# Patient Record
Sex: Female | Born: 1935 | Race: White | Hispanic: No | Smoking: Never smoker
Health system: Southern US, Community
[De-identification: ages and names within clinical notes are randomized; demographics above are authoritative.]

## PROBLEM LIST (undated history)

## (undated) DIAGNOSIS — E7800 Pure hypercholesterolemia, unspecified: Secondary | ICD-10-CM

## (undated) DIAGNOSIS — N390 Urinary tract infection, site not specified: Secondary | ICD-10-CM

## (undated) DIAGNOSIS — I499 Cardiac arrhythmia, unspecified: Secondary | ICD-10-CM

## (undated) DIAGNOSIS — H9190 Unspecified hearing loss, unspecified ear: Secondary | ICD-10-CM

## (undated) DIAGNOSIS — M1990 Unspecified osteoarthritis, unspecified site: Secondary | ICD-10-CM

## (undated) DIAGNOSIS — K635 Polyp of colon: Secondary | ICD-10-CM

## (undated) DIAGNOSIS — R519 Headache, unspecified: Secondary | ICD-10-CM

## (undated) DIAGNOSIS — I10 Essential (primary) hypertension: Secondary | ICD-10-CM

## (undated) DIAGNOSIS — H3530 Unspecified macular degeneration: Secondary | ICD-10-CM

## (undated) HISTORY — DX: Urinary tract infection, site not specified: N39.0

## (undated) HISTORY — DX: Unspecified osteoarthritis, unspecified site: M19.90

## (undated) HISTORY — DX: Unspecified macular degeneration: H35.30

## (undated) HISTORY — DX: Polyp of colon: K63.5

---

## 1958-12-15 HISTORY — PX: ECTOPIC PREGNANCY SURGERY: SHX613

## 1978-12-15 HISTORY — PX: ABDOMINAL HYSTERECTOMY: SHX81

## 1988-12-15 HISTORY — PX: ACHILLES TENDON SURGERY: SHX542

## 1995-12-16 HISTORY — PX: ACHILLES TENDON SURGERY: SHX542

## 2004-11-25 ENCOUNTER — Ambulatory Visit: Payer: Self-pay | Admitting: Unknown Physician Specialty

## 2006-01-27 ENCOUNTER — Ambulatory Visit: Payer: Self-pay | Admitting: Unknown Physician Specialty

## 2006-04-01 ENCOUNTER — Ambulatory Visit: Payer: Self-pay | Admitting: Unknown Physician Specialty

## 2007-01-28 ENCOUNTER — Ambulatory Visit: Payer: Self-pay | Admitting: Unknown Physician Specialty

## 2008-03-01 ENCOUNTER — Ambulatory Visit: Payer: Self-pay | Admitting: Unknown Physician Specialty

## 2009-03-05 ENCOUNTER — Ambulatory Visit: Payer: Self-pay | Admitting: Unknown Physician Specialty

## 2009-12-15 LAB — HM PAP SMEAR

## 2010-03-20 ENCOUNTER — Ambulatory Visit: Payer: Self-pay | Admitting: Unknown Physician Specialty

## 2010-05-31 ENCOUNTER — Encounter: Admission: RE | Admit: 2010-05-31 | Discharge: 2010-05-31 | Payer: Self-pay | Admitting: Podiatry

## 2010-12-15 LAB — HM COLONOSCOPY

## 2011-01-05 ENCOUNTER — Encounter: Payer: Self-pay | Admitting: Podiatry

## 2011-04-03 ENCOUNTER — Ambulatory Visit: Payer: Self-pay | Admitting: Unknown Physician Specialty

## 2011-07-17 ENCOUNTER — Ambulatory Visit: Payer: Self-pay | Admitting: Unknown Physician Specialty

## 2011-07-18 LAB — PATHOLOGY REPORT

## 2012-06-15 ENCOUNTER — Ambulatory Visit: Payer: Self-pay | Admitting: Internal Medicine

## 2013-07-18 ENCOUNTER — Ambulatory Visit: Payer: Self-pay | Admitting: Internal Medicine

## 2014-08-14 DIAGNOSIS — I10 Essential (primary) hypertension: Secondary | ICD-10-CM | POA: Insufficient documentation

## 2014-08-14 DIAGNOSIS — R002 Palpitations: Secondary | ICD-10-CM | POA: Insufficient documentation

## 2014-08-14 DIAGNOSIS — E785 Hyperlipidemia, unspecified: Secondary | ICD-10-CM | POA: Insufficient documentation

## 2014-08-15 LAB — HM MAMMOGRAPHY: HM MAMMO: NORMAL

## 2014-09-08 ENCOUNTER — Ambulatory Visit: Payer: Self-pay | Admitting: Family Medicine

## 2014-09-21 ENCOUNTER — Ambulatory Visit: Payer: Self-pay | Admitting: Family Medicine

## 2015-01-22 ENCOUNTER — Encounter: Payer: Self-pay | Admitting: Internal Medicine

## 2015-01-22 ENCOUNTER — Ambulatory Visit (INDEPENDENT_AMBULATORY_CARE_PROVIDER_SITE_OTHER): Payer: PPO | Admitting: Internal Medicine

## 2015-01-22 ENCOUNTER — Encounter: Payer: Self-pay | Admitting: *Deleted

## 2015-01-22 ENCOUNTER — Ambulatory Visit (INDEPENDENT_AMBULATORY_CARE_PROVIDER_SITE_OTHER)
Admission: RE | Admit: 2015-01-22 | Discharge: 2015-01-22 | Disposition: A | Discharge status: Home / Self Care | Payer: PPO | Source: Ambulatory Visit | Attending: Internal Medicine | Admitting: Internal Medicine

## 2015-01-22 VITALS — BP 135/55 | HR 55 | Temp 97.7°F | Resp 14 | Ht 63.5 in | Wt 167.5 lb

## 2015-01-22 DIAGNOSIS — R002 Palpitations: Secondary | ICD-10-CM

## 2015-01-22 DIAGNOSIS — E785 Hyperlipidemia, unspecified: Secondary | ICD-10-CM

## 2015-01-22 DIAGNOSIS — M79671 Pain in right foot: Secondary | ICD-10-CM | POA: Insufficient documentation

## 2015-01-22 DIAGNOSIS — I10 Essential (primary) hypertension: Secondary | ICD-10-CM

## 2015-01-22 LAB — COMPREHENSIVE METABOLIC PANEL
ALT: 16 U/L (ref 0–35)
AST: 20 U/L (ref 0–37)
Albumin: 3.9 g/dL (ref 3.5–5.2)
Alkaline Phosphatase: 59 U/L (ref 39–117)
BUN: 22 mg/dL (ref 6–23)
CO2: 28 meq/L (ref 19–32)
Calcium: 9.2 mg/dL (ref 8.4–10.5)
Chloride: 105 mEq/L (ref 96–112)
Creatinine, Ser: 0.99 mg/dL (ref 0.40–1.20)
GFR: 56.7 mL/min — ABNORMAL LOW (ref >60.00)
GLUCOSE: 94 mg/dL (ref 70–99)
POTASSIUM: 4.2 meq/L (ref 3.5–5.1)
Sodium: 139 mEq/L (ref 135–145)
Total Bilirubin: 0.5 mg/dL (ref 0.2–1.2)
Total Protein: 6.4 g/dL (ref 6.0–8.3)

## 2015-01-22 MED ORDER — TRIAMTERENE-HCTZ 37.5-25 MG PO TABS: 0.5000 | ORAL_TABLET | Freq: Every day | ORAL | Status: DC

## 2015-01-22 MED ORDER — ROSUVASTATIN CALCIUM 10 MG PO TABS: 5.0000 mg | ORAL_TABLET | ORAL | Status: DC

## 2015-01-22 MED ORDER — METOPROLOL SUCCINATE ER 50 MG PO TB24: 50.0000 mg | ORAL_TABLET | Freq: Every day | ORAL | Status: DC

## 2015-01-22 NOTE — Assessment & Plan Note (Signed)
Mild right foot pain at 2nd metatarsal after injury. Will get plain xray given persistence of pan.

## 2015-01-22 NOTE — Progress Notes (Signed)
Pre visit review using our clinic review tool, if applicable. No additional management support is needed unless otherwise documented below in the visit note. 

## 2015-01-22 NOTE — Assessment & Plan Note (Signed)
BP Readings from Last 3 Encounters:  01/22/15 135/55   BP well controlled on current mediations. Renal function with labs.

## 2015-01-22 NOTE — Assessment & Plan Note (Addendum)
Will check lipids and LFTs with labs. Continue Crestor. 

## 2015-01-22 NOTE — Assessment & Plan Note (Signed)
Symptoms well controlled with Metoprolol. Will continue.

## 2015-01-22 NOTE — Patient Instructions (Addendum)
Labs today.  Xray today of right foot.  Follow up in 6 months for Wellness Exam

## 2015-01-22 NOTE — Progress Notes (Signed)
Subjective:    Patient ID: Jessica Reynolds, female    DOB: May 06, 1928, 81 y.o.   MRN: [id number]  HPI 81YO female presents to establish care.  Injured right foot by slamming on tennis court about 2-3 weeks ago, when trying to get rock out of shoe. Foot was painful and swollen at time, but she was able to walk on it. Some persistent pain distally at base of 2nd toe. Swelling has resolved.  Aside from this, feeling well. Compliant with meds.  Past medical, surgical, family and social history per today's encounter.  Review of Systems  Constitutional: Negative for fever, chills, appetite change, fatigue and unexpected weight change.  Eyes: Negative for visual disturbance.  Respiratory: Negative for shortness of breath.   Cardiovascular: Negative for chest pain and leg swelling.  Gastrointestinal: Negative for nausea, vomiting, abdominal pain, diarrhea and constipation.  Musculoskeletal: Positive for arthralgias. Negative for myalgias, joint swelling and gait problem.  Skin: Negative for color change and rash.  Hematological: Negative for adenopathy. Does not bruise/bleed easily.  Psychiatric/Behavioral: Negative for suicidal ideas, sleep disturbance and dysphoric mood. The patient is not nervous/anxious.        Objective:    BP 135/55 mmHg  Pulse 55  Temp(Src) 97.7 F (36.5 C) (Oral)  Resp 14  Ht 5' 3.5" (1.613 m)  Wt 167 lb 8 oz (75.978 kg)  BMI 29.20 kg/m2  SpO2 99% Physical Exam  Constitutional: She is oriented to person, place, and time. She appears well-developed and well-nourished. No distress.  HENT:  Head: Normocephalic and atraumatic.  Right Ear: External ear normal.  Left Ear: External ear normal.  Nose: Nose normal.  Mouth/Throat: Oropharynx is clear and moist. No oropharyngeal exudate.  Eyes: Conjunctivae are normal. Pupils are equal, round, and reactive to light. Right eye exhibits no discharge. Left eye exhibits no discharge. No scleral icterus.  Neck:  Normal range of motion. Neck supple. No tracheal deviation present. No thyromegaly present.  Cardiovascular: Normal rate, regular rhythm, normal heart sounds and intact distal pulses.  Exam reveals no gallop and no friction rub.   No murmur heard. Pulmonary/Chest: Effort normal and breath sounds normal. No respiratory distress. She has no wheezes. She has no rales. She exhibits no tenderness.  Abdominal: Soft. Bowel sounds are normal. She exhibits no distension and no mass. There is no tenderness. There is no rebound and no guarding.  Musculoskeletal: Normal range of motion. She exhibits no edema.       Right foot: There is tenderness. There is normal range of motion and no bony tenderness.       Feet:  Lymphadenopathy:    She has no cervical adenopathy.  Neurological: She is alert and oriented to person, place, and time. No cranial nerve deficit. She exhibits normal muscle tone. Coordination normal.  Skin: Skin is warm and dry. No rash noted. She is not diaphoretic. No erythema. No pallor.  Psychiatric: She has a normal mood and affect. Her behavior is normal. Judgment and thought content normal.          Assessment & Plan:   Problem List Items Addressed This Visit      Unprioritized   Awareness of heartbeats    Symptoms well controlled with Metoprolol. Will continue.      Benign essential HTN - Primary    BP Readings from Last 3 Encounters:  01/22/15 135/55   BP well controlled on current mediations. Renal function with labs.      Relevant Medications  rosuvastatin (CRESTOR) tablet   triamterene-hydrochlorothiazide (MAXZIDE-25) 37.5-25 MG per tablet   metoprolol succinate (TOPROL-XL) 24 hr tablet   Other Relevant Orders   Comprehensive metabolic panel   HLD (hyperlipidemia)    Will check lipids and LFTs with labs. Continue Crestor      Relevant Medications   rosuvastatin (CRESTOR) tablet   triamterene-hydrochlorothiazide (MAXZIDE-25) 37.5-25 MG per tablet    metoprolol succinate (TOPROL-XL) 24 hr tablet   Right foot pain    Mild right foot pain at 2nd metatarsal after injury. Will get plain xray given persistence of pan.      Relevant Orders   DG Foot 2 Views Right       Return in about 6 months (around 07/23/2015) for Wellness Visit.

## 2015-07-23 ENCOUNTER — Ambulatory Visit (INDEPENDENT_AMBULATORY_CARE_PROVIDER_SITE_OTHER): Payer: PPO | Admitting: Internal Medicine

## 2015-07-23 ENCOUNTER — Encounter: Payer: Self-pay | Admitting: Internal Medicine

## 2015-07-23 VITALS — BP 124/68 | HR 53 | Temp 98.2°F | Ht 64.25 in | Wt 175.6 lb

## 2015-07-23 DIAGNOSIS — Z0000 Encounter for general adult medical examination without abnormal findings: Secondary | ICD-10-CM | POA: Diagnosis not present

## 2015-07-23 LAB — COMPREHENSIVE METABOLIC PANEL
ALBUMIN: 4 g/dL (ref 3.5–5.2)
ALK PHOS: 58 U/L (ref 39–117)
ALT: 17 U/L (ref 0–35)
AST: 21 U/L (ref 0–37)
BUN: 12 mg/dL (ref 6–23)
CO2: 27 mEq/L (ref 19–32)
Calcium: 9.1 mg/dL (ref 8.4–10.5)
Chloride: 105 mEq/L (ref 96–112)
Creatinine, Ser: 0.99 mg/dL (ref 0.40–1.20)
GFR: 56.63 mL/min — AB (ref >60.00)
Glucose, Bld: 86 mg/dL (ref 70–99)
POTASSIUM: 3.8 meq/L (ref 3.5–5.1)
SODIUM: 141 meq/L (ref 135–145)
Total Bilirubin: 0.6 mg/dL (ref 0.2–1.2)
Total Protein: 6.2 g/dL (ref 6.0–8.3)

## 2015-07-23 LAB — MICROALBUMIN / CREATININE URINE RATIO
Creatinine,U: 41 mg/dL
Microalb Creat Ratio: 1.7 mg/g (ref 0.0–30.0)
Microalb, Ur: <0.7 mg/dL (ref 0.0–1.9)

## 2015-07-23 LAB — CBC WITH DIFFERENTIAL/PLATELET
BASOS PCT: 0.4 % (ref 0.0–3.0)
Basophils Absolute: 0 10*3/uL (ref 0.0–0.1)
EOS PCT: 3.4 % (ref 0.0–5.0)
Eosinophils Absolute: 0.2 10*3/uL (ref 0.0–0.7)
HEMATOCRIT: 42.2 % (ref 36.0–46.0)
Hemoglobin: 14.3 g/dL (ref 12.0–15.0)
Lymphocytes Relative: 19.1 % (ref 12.0–46.0)
Lymphs Abs: 1 10*3/uL (ref 0.7–4.0)
MCHC: 33.8 g/dL (ref 30.0–36.0)
MCV: 93 fl (ref 78.0–100.0)
Monocytes Absolute: 0.4 10*3/uL (ref 0.1–1.0)
Monocytes Relative: 7 % (ref 3.0–12.0)
NEUTROS PCT: 70.1 % (ref 43.0–77.0)
Neutro Abs: 3.8 10*3/uL (ref 1.4–7.7)
Platelets: 175 10*3/uL (ref 150.0–400.0)
RBC: 4.53 Mil/uL (ref 3.87–5.11)
RDW: 13.9 % (ref 11.5–15.5)
WBC: 5.4 10*3/uL (ref 4.0–10.5)

## 2015-07-23 LAB — LIPID PANEL
Cholesterol: 238 mg/dL — ABNORMAL HIGH (ref 0–200)
HDL: 51.3 mg/dL (ref >39.00)
LDL CALC: 156 mg/dL — AB (ref 0–99)
NonHDL: 186.73
TRIGLYCERIDES: 156 mg/dL — AB (ref 0.0–149.0)
Total CHOL/HDL Ratio: 5
VLDL: 31.2 mg/dL (ref 0.0–40.0)

## 2015-07-23 NOTE — Addendum Note (Signed)
Addended by: Nanci Pina on: 07/23/2015 09:01 AM   Modules accepted: Miquel Dunn

## 2015-07-23 NOTE — Patient Instructions (Signed)

## 2015-07-23 NOTE — Progress Notes (Signed)
The patient is here for annual Medicare Wellness Examination and management of other chronic and acute problems.   The risk factors are reflected in the history.  The roster of all physicians providing medical care to patient - is listed in the Snapshot section of the chart.  Activities of daily living:   The patient is 100% independent in all ADLs: dressing, toileting, feeding as well as independent mobility. Patient lives with son. No pets. Lives in two story home. No difficulty with stairs.  Home safety :  The patient has smoke detectors in the home.  They wear seatbelts in their car. There are no firearms at home.  There is no violence in the home. They feel safe where they live.  Infectious Risks: There is no risks for hepatitis, STDs or HIV.  There is a history of blood transfusion with ectopic pregnancy in 1960. They have no travel history to infectious disease endemic areas of the world.  Additional Health Care Providers: The patient has seen their dentist in the last six months. Dentist - Dr. Jacelyn Grip They have seen their eye doctor in the last year. Opthalmologist - [facility] They deny hearing issues. They have deferred audiologic testing in the last year.   They do not  have excessive sun exposure. Discussed the need for sun protection: hats,long sleeves and use of sunscreen if there is significant sun exposure.  Dermatologist - Dr. Nehemiah Massed  Diet: the importance of a healthy diet is discussed. They do have a relatively healthy diet.  The benefits of regular aerobic exercise were discussed. Patient exercises by playing tennis.  Depression screen: there are no signs or vegative symptoms of depression- irritability, change in appetite, anhedonia, sadness/tearfullness.  Cognitive assessment: the patient manages all their financial and personal affairs and is actively engaged. They could relate day,date,year and events.  HCPOA - in place Living Will - in place  The  following portions of the patient's history were reviewed and updated as appropriate: allergies, current medications, past family history, past medical history,  past surgical history, past social history and problem list.  Visual acuity was not assessed per patient preference as they have regular follow up with their ophthalmologist. Hearing and body mass index were assessed and reviewed.   During the course of the visit the patient was educated and counseled about appropriate screening and preventive services including : fall prevention , diabetes screening, nutrition counseling, colorectal cancer screening, and recommended immunizations.    Review of Systems  Constitutional: Negative for fever, chills, appetite change, fatigue and unexpected weight change.  Eyes: Negative for visual disturbance.  Respiratory: Negative for shortness of breath.   Cardiovascular: Negative for chest pain and leg swelling.  Gastrointestinal: Negative for nausea, vomiting, abdominal pain, diarrhea, constipation and blood in stool.  Musculoskeletal: Negative for myalgias and arthralgias.  Skin: Negative for color change and rash.  Hematological: Negative for adenopathy. Does not bruise/bleed easily.  Psychiatric/Behavioral: Negative for suicidal ideas, sleep disturbance and dysphoric mood. The patient is not nervous/anxious.        Objective:    BP 124/68 mmHg  Pulse 53  Temp(Src) 98.2 F (36.8 C) (Oral)  Ht 5' 4.25" (1.632 m)  Wt 175 lb 9.6 oz (79.652 kg)  BMI 29.91 kg/m2  SpO2 96% Physical Exam  Constitutional: She is oriented to person, place, and time. She appears well-developed and well-nourished. No distress.  HENT:  Head: Normocephalic and atraumatic.  Right Ear: External ear normal.  Left Ear: External ear  normal.  Nose: Nose normal.  Mouth/Throat: Oropharynx is clear and moist. No oropharyngeal exudate.  Eyes: Conjunctivae are normal. Pupils are equal, round, and reactive to light. Right eye  exhibits no discharge. Left eye exhibits no discharge. No scleral icterus.  Neck: Normal range of motion. Neck supple. No tracheal deviation present. No thyromegaly present.  Cardiovascular: Normal rate, regular rhythm, normal heart sounds and intact distal pulses.  Exam reveals no gallop and no friction rub.   No murmur heard. Pulmonary/Chest: Effort normal and breath sounds normal. No accessory muscle usage. No tachypnea. No respiratory distress. She has no decreased breath sounds. She has no wheezes. She has no rales. She exhibits no tenderness. Right breast exhibits no inverted nipple, no mass, no nipple discharge, no skin change and no tenderness. Left breast exhibits no inverted nipple, no mass, no nipple discharge, no skin change and no tenderness. Breasts are symmetrical.  Abdominal: Soft. Bowel sounds are normal. She exhibits no distension and no mass. There is no tenderness. There is no rebound and no guarding.  Musculoskeletal: Normal range of motion. She exhibits no edema or tenderness.  Lymphadenopathy:    She has no cervical adenopathy.  Neurological: She is alert and oriented to person, place, and time. No cranial nerve deficit. She exhibits normal muscle tone. Coordination normal.  Skin: Skin is warm and dry. No rash noted. She is not diaphoretic. No erythema. No pallor.  Psychiatric: She has a normal mood and affect. Her behavior is normal. Judgment and thought content normal.          Assessment & Plan:  Patient was given a handout regarding current recommendations for health maintenance and preventative care on the AVS.  Problem List Items Addressed This Visit      Unprioritized   Medicare annual wellness visit, subsequent - Primary    General medical exam including breast exam normal today. Labs as ordered. Mammogram ordered. Colonoscopy UTD. Immunizations UTD except for Prevnar, however she believes she had this at [facility] and we will request records. Encouraged  healthy diet and exercise.      Relevant Orders   CBC with Differential/Platelet   Comprehensive metabolic panel   Lipid panel   Microalbumin / creatinine urine ratio   Hepatitis C antibody   MM Digital Screening       Return in about 6 months (around 01/23/2016) for Recheck.

## 2015-07-23 NOTE — Assessment & Plan Note (Signed)
General medical exam including breast exam normal today. Labs as ordered. Mammogram ordered. Colonoscopy UTD. Immunizations UTD except for Prevnar, however she believes she had this at [facility] and we will request records. Encouraged healthy diet and exercise.

## 2015-07-23 NOTE — Progress Notes (Signed)
Pre visit review using our clinic review tool, if applicable. No additional management support is needed unless otherwise documented below in the visit note. 

## 2015-07-24 LAB — HEPATITIS C ANTIBODY: HCV AB: NEGATIVE

## 2015-07-24 NOTE — Addendum Note (Signed)
Addended byCloyd Stagers B on: 07/24/2015 11:46 AM   Modules accepted: Miquel Dunn

## 2015-07-24 NOTE — Addendum Note (Signed)
Addended byCloyd Stagers B on: 07/24/2015 11:30 AM   Modules accepted: SmartSet

## 2015-07-24 NOTE — Addendum Note (Signed)
Addended byCloyd Stagers B on: 07/24/2015 11:28 AM   Modules accepted: Miquel Dunn

## 2015-09-25 ENCOUNTER — Ambulatory Visit
Admission: RE | Admit: 2015-09-25 | Discharge: 2015-09-25 | Disposition: A | Discharge status: Home / Self Care | Payer: PPO | Source: Ambulatory Visit | Attending: Internal Medicine | Admitting: Internal Medicine

## 2015-09-25 DIAGNOSIS — Z1231 Encounter for screening mammogram for malignant neoplasm of breast: Secondary | ICD-10-CM | POA: Insufficient documentation

## 2015-09-25 DIAGNOSIS — Z0000 Encounter for general adult medical examination without abnormal findings: Secondary | ICD-10-CM | POA: Insufficient documentation

## 2015-09-27 ENCOUNTER — Other Ambulatory Visit: Payer: Self-pay | Admitting: Internal Medicine

## 2015-09-27 ENCOUNTER — Other Ambulatory Visit: Payer: Self-pay

## 2015-09-27 DIAGNOSIS — R928 Other abnormal and inconclusive findings on diagnostic imaging of breast: Secondary | ICD-10-CM

## 2015-09-27 MED ORDER — METOPROLOL SUCCINATE ER 50 MG PO TB24: 50.0000 mg | ORAL_TABLET | Freq: Every day | ORAL | Status: DC

## 2015-09-27 MED ORDER — ROSUVASTATIN CALCIUM 10 MG PO TABS: 5.0000 mg | ORAL_TABLET | ORAL | Status: DC

## 2015-09-27 MED ORDER — TRIAMTERENE-HCTZ 37.5-25 MG PO TABS: 0.5000 | ORAL_TABLET | Freq: Every day | ORAL | Status: DC

## 2015-10-01 ENCOUNTER — Other Ambulatory Visit: Payer: Self-pay

## 2015-10-01 ENCOUNTER — Telehealth: Payer: Self-pay | Admitting: *Deleted

## 2015-10-01 MED ORDER — ROSUVASTATIN CALCIUM 5 MG PO TABS: 5.0000 mg | ORAL_TABLET | ORAL | Status: DC

## 2015-10-01 MED ORDER — FLUTICASONE PROPIONATE 50 MCG/ACT NA SUSP: 2.0000 | Freq: Every day | NASAL | Status: DC

## 2015-10-01 NOTE — Telephone Encounter (Signed)
This is not on patient's medication list.  Please advise?

## 2015-10-01 NOTE — Telephone Encounter (Signed)
Patient would like to refill Fluticasone 50 mg  . Did not see in med list. [facility]

## 2015-10-01 NOTE — Telephone Encounter (Signed)
Rx sent 

## 2015-10-10 ENCOUNTER — Ambulatory Visit
Admission: RE | Admit: 2015-10-10 | Discharge: 2015-10-10 | Disposition: A | Discharge status: Home / Self Care | Payer: PPO | Source: Ambulatory Visit | Attending: Internal Medicine | Admitting: Internal Medicine

## 2015-10-10 DIAGNOSIS — N63 Unspecified lump in breast: Secondary | ICD-10-CM | POA: Insufficient documentation

## 2015-10-10 DIAGNOSIS — R928 Other abnormal and inconclusive findings on diagnostic imaging of breast: Secondary | ICD-10-CM

## 2015-10-24 ENCOUNTER — Ambulatory Visit (INDEPENDENT_AMBULATORY_CARE_PROVIDER_SITE_OTHER): Payer: PPO

## 2015-10-24 DIAGNOSIS — Z23 Encounter for immunization: Secondary | ICD-10-CM

## 2015-12-21 ENCOUNTER — Telehealth: Payer: Self-pay | Admitting: Internal Medicine

## 2015-12-21 NOTE — Telephone Encounter (Signed)
Patient Name: Jessica Reynolds  DOB: 1928-04-23    Initial Comment Caller states may have a TIA- confusion   Nurse Assessment  Nurse: Mallie Mussel, RN, Alveta Heimlich Date/Time (Eastern Time): 12/21/2015 12:54:13 PM  Confirm and document reason for call. If symptomatic, describe symptoms. ---Caller states that she feels she had a TIA about an hour ago. At that time, she began having trouble with her speech. She was in a store, left and drove herself home. Her speech is clear now. This only lasted about 3-4 minutes. She has a headache at present which began about the time she got to her car. She rates the pain as 2-3 on 0-10 scale. Denies eye sagging and her smile is the same on both sides. She is able to make a fist with both hands and they both feel the same. She is able to extend her arms and hold them at the same level .  Has the patient traveled out of the country within the last 30 days? ---No  Does the patient have any new or worsening symptoms? ---Yes  Will a triage be completed? ---Yes  Related visit to physician within the last 2 weeks? ---No  Does the PT have any chronic conditions? (i.e. diabetes, asthma, etc.) ---Unknown  Is this a behavioral health or substance abuse call? ---No     Guidelines    Guideline Title Affirmed Question Affirmed Notes  Headache [1] New headache AND [2] age > 84    Final Disposition User   See Physician within [place], RN, Alveta Heimlich    Comments  1301- I began hearing a beeping sound on the phone and I asked if we were about to lose connection (this has happened in the past with that sound). She states that she is on a land line. Call was lost at that point. I tried x 3 to call her back, each time I received her voicemail. I left a message on the 3rd one that i will try to call back in about 10 minutes.  1307- Attempted to call back. Female states that I have a wrong number. Lead PC Jasmine asked to double check the number.  After review number on the chart should be  [phone]  No appointments available at [facility] or [facility]. She will go to an UC in [place], it is closer.   Referrals  GO TO FACILITY OTHER - SPECIFY   Disagree/Comply: Comply

## 2015-12-24 NOTE — Telephone Encounter (Signed)
I called to check in on patient.  She states she followed up with Eye doctor and was diagnosised with a "Aura" this has happened before, has a follow up with him again.  Denies any follow up with [place] at this point.  FYI.

## 2015-12-28 ENCOUNTER — Ambulatory Visit: Payer: PPO | Admitting: Family Medicine

## 2016-01-09 ENCOUNTER — Emergency Department
Admission: EM | Admit: 2016-01-09 | Discharge: 2016-01-09 | Disposition: A | Discharge status: Home / Self Care | Payer: PPO | Attending: Emergency Medicine | Admitting: Emergency Medicine

## 2016-01-09 ENCOUNTER — Emergency Department: Payer: PPO

## 2016-01-09 ENCOUNTER — Encounter: Payer: Self-pay | Admitting: *Deleted

## 2016-01-09 DIAGNOSIS — F419 Anxiety disorder, unspecified: Secondary | ICD-10-CM | POA: Diagnosis not present

## 2016-01-09 DIAGNOSIS — Z77098 Contact with and (suspected) exposure to other hazardous, chiefly nonmedicinal, chemicals: Secondary | ICD-10-CM | POA: Diagnosis not present

## 2016-01-09 DIAGNOSIS — Z79899 Other long term (current) drug therapy: Secondary | ICD-10-CM | POA: Insufficient documentation

## 2016-01-09 DIAGNOSIS — I10 Essential (primary) hypertension: Secondary | ICD-10-CM | POA: Insufficient documentation

## 2016-01-09 DIAGNOSIS — R51 Headache: Secondary | ICD-10-CM | POA: Insufficient documentation

## 2016-01-09 DIAGNOSIS — Z7951 Long term (current) use of inhaled steroids: Secondary | ICD-10-CM | POA: Diagnosis not present

## 2016-01-09 DIAGNOSIS — R519 Headache, unspecified: Secondary | ICD-10-CM

## 2016-01-09 DIAGNOSIS — R0602 Shortness of breath: Secondary | ICD-10-CM | POA: Diagnosis not present

## 2016-01-09 LAB — BLOOD GAS, ARTERIAL
ALLENS TEST (PASS/FAIL): POSITIVE — AB
Acid-Base Excess: 0.6 mmol/L (ref 0.0–3.0)
BICARBONATE: 24.5 meq/L (ref 21.0–28.0)
FIO2: 0.21
O2 Saturation: 96 %
PATIENT TEMPERATURE: 37
PCO2 ART: 36 mmHg (ref 32.0–48.0)
PH ART: 7.44 (ref 7.350–7.450)
PO2 ART: 79 mmHg — AB (ref 83.0–108.0)

## 2016-01-09 LAB — CARBOXYHEMOGLOBIN
CARBOXYHEMOGLOBIN: 1.7 % (ref 1.5–9.0)
Total oxygen content: 94.7 mL/dL

## 2016-01-09 LAB — CBC
HCT: 42.7 % (ref 35.0–47.0)
HEMOGLOBIN: 14 g/dL (ref 12.0–16.0)
MCH: 30.1 pg (ref 26.0–34.0)
MCHC: 32.7 g/dL (ref 32.0–36.0)
MCV: 92.2 fL (ref 80.0–100.0)
Platelets: 174 10*3/uL (ref 150–440)
RBC: 4.63 MIL/uL (ref 3.80–5.20)
RDW: 14 % (ref 11.5–14.5)
WBC: 6 10*3/uL (ref 3.6–11.0)

## 2016-01-09 LAB — BASIC METABOLIC PANEL
Anion gap: 7 (ref 5–15)
BUN: 23 mg/dL — AB (ref 6–20)
CHLORIDE: 107 mmol/L (ref 101–111)
CO2: 27 mmol/L (ref 22–32)
CREATININE: 0.99 mg/dL (ref 0.44–1.00)
Calcium: 9.2 mg/dL (ref 8.9–10.3)
GFR calc Af Amer: 59 mL/min — ABNORMAL LOW (ref >60)
GFR calc non Af Amer: 51 mL/min — ABNORMAL LOW (ref >60)
GLUCOSE: 145 mg/dL — AB (ref 65–99)
Potassium: 3.7 mmol/L (ref 3.5–5.1)
SODIUM: 141 mmol/L (ref 135–145)

## 2016-01-09 NOTE — Discharge Instructions (Signed)
You were evaluated for headaches, and although no certain cause was found, your exam and evaluation are reassuring tonight and emergency department.  Return to the emergency department for any worsening condition including confusion or altered mental status, weakness, numbness, or any other symptoms concerning to you.   General Headache Without Cause A headache is pain or discomfort felt around the head or neck area. There are many causes and types of headaches. In some cases, the cause may not be found.  HOME CARE  Managing Pain  Take over-the-counter and prescription medicines only as told by your doctor.  Lie down in a dark, quiet room when you have a headache.  If directed, apply ice to the head and neck area:  Put ice in a plastic bag.  Place a towel between your skin and the bag.  Leave the ice on for 20 minutes, 2-3 times per day.  Use a heating pad or hot shower to apply heat to the head and neck area as told by your doctor.  Keep lights dim if bright lights bother you or make your headaches worse. Eating and Drinking  Eat meals on a regular schedule.  Lessen how much alcohol you drink.  Lessen how much caffeine you drink, or stop drinking caffeine. General Instructions  Keep all follow-up visits as told by your doctor. This is important.  Keep a journal to find out if certain things bring on headaches. For example, write down:  What you eat and drink.  How much sleep you get.  Any change to your diet or medicines.  Relax by getting a massage or doing other relaxing activities.  Lessen stress.  Sit up straight. Do not tighten (tense) your muscles.  Do not use tobacco products. This includes cigarettes, chewing tobacco, or e-cigarettes. If you need help quitting, ask your doctor.  Exercise regularly as told by your doctor.  Get enough sleep. This often means 7-9 hours of sleep. GET HELP IF:  Your symptoms are not helped by medicine.  You have a  headache that feels different than the other headaches.  You feel sick to your stomach (nauseous) or you throw up (vomit).  You have a fever. GET HELP RIGHT AWAY IF:   Your headache becomes really bad.  You keep throwing up.  You have a stiff neck.  You have trouble seeing.  You have trouble speaking.  You have pain in the eye or ear.  Your muscles are weak or you lose muscle control.  You lose your balance or have trouble walking.  You feel like you will pass out (faint) or you pass out.  You have confusion.   This information is not intended to replace advice given to you by your health care provider. Make sure you discuss any questions you have with your health care provider.   Document Released: 09/09/2008 Document Revised: 08/22/2015 Document Reviewed: 03/26/2015 Elsevier Interactive Patient Education [employer].

## 2016-01-09 NOTE — ED Provider Notes (Signed)
[facility] Emergency Department Provider Note   ____________________________________________  Time seen:  I have reviewed the triage vital signs and the triage nursing note.  HISTORY  Chief Complaint Toxic Inhalation and Headache   Historian Patient  HPI Jessica Reynolds is a 82 y.o. female who lives at her home with her handicapped son, is here for evaluation of chronic frequent headaches over the past several months. They're global in nature. No vision changes. No neck pain. No fevers. Several weeks ago during a cold spell her furnace was "overworking" and her carbon monoxide detector went off. She unplugged the carbon monoxide detector did not seek evaluation. She has had the furnace turned off awaiting a new unit. However since her headaches have continued she decided that she wanted to be evaluated to make sure she did not experience carbon monoxidepoisoning.  She's been under a lot of stress recently. Multiple social stressors between grief losing her husband several years ago, trying to sell her parents house, dealing with health issues of her sister who lives in another state. No chest pain or trouble breathing. No nausea or vomiting or diarrhea.    Past Medical History  Diagnosis Date  . Arthritis   . Colon polyp   . UTI (lower urinary tract infection)     Patient Active Problem List   Diagnosis Date Noted  . Medicare annual wellness visit, subsequent 07/23/2015  . Right foot pain 01/22/2015  . Benign essential HTN 08/14/2014  . HLD (hyperlipidemia) 08/14/2014  . Awareness of heartbeats 08/14/2014    Past Surgical History  Procedure Laterality Date  . Ectopic pregnancy surgery  1960  . Achilles tendon surgery Right 1990  . Achilles tendon surgery Left 1997  . Vaginal delivery      4  . Abdominal hysterectomy  1980    BSO, fibroids    Current Outpatient Rx  Name  Route  Sig  Dispense  Refill  . Cholecalciferol (VITAMIN D) 2000 UNITS  tablet   Oral   Take 2,000 Units by mouth daily.         . fluticasone (FLONASE) 50 MCG/ACT nasal spray   Each Nare   Place 2 sprays into both nostrils daily.   16 g   6   . metoprolol succinate (TOPROL-XL) 50 MG 24 hr tablet   Oral   Take 1 tablet (50 mg total) by mouth daily. TAKE ONE TABLET BY MOUTH ONCE DAILY   90 tablet   3   . Multiple Vitamins-Minerals (PRESERVISION/LUTEIN) CAPS   Oral   Take 2 capsules by mouth daily.         . naproxen sodium (ANAPROX) 220 MG tablet   Oral   Take 220 mg by mouth 2 (two) times daily as needed.         . rosuvastatin (CRESTOR) 5 MG tablet   Oral   Take 1 tablet (5 mg total) by mouth 3 (three) times a week.   12 tablet   3   . triamterene-hydrochlorothiazide (MAXZIDE-25) 37.5-25 MG tablet   Oral   Take 0.5 tablets by mouth daily. Take 1/2 tablet by mouth every morning.   90 tablet   3     Allergies Review of patient's allergies indicates no known allergies.  Family History  Problem Relation Age of Onset  . Stroke Mother   . Heart disease Father   . Macular degeneration Sister   . Diabetes Sister   . Breast cancer Neg Hx  Social History Social History  Substance Use Topics  . Smoking status: Never Smoker   . Smokeless tobacco: Never Used  . Alcohol Use: Yes     Comment: social    Review of Systems  Constitutional: Negative for fever. Eyes: Negative for visual changes. ENT: Negative for sore throat. Cardiovascular: Negative for chest pain. Respiratory: Negative for shortness of breath. Gastrointestinal: Negative for abdominal pain, vomiting and diarrhea. Genitourinary: Negative for dysuria. Musculoskeletal: Negative for back pain. Skin: Negative for rash. Neurological: No headache now but history of headaches.. 10 point Review of Systems otherwise negative ____________________________________________   PHYSICAL EXAM:  VITAL SIGNS: ED Triage Vitals  Enc Vitals Group     BP 01/09/16 1725  156/66 mmHg     Pulse Rate 01/09/16 1725 63     Resp 01/09/16 1725 16     Temp 01/09/16 1725 97.5 F (36.4 C)     Temp Source 01/09/16 1725 Oral     SpO2 01/09/16 1725 98 %     Weight 01/09/16 1725 165 lb (74.844 kg)     Height 01/09/16 1725 5\' 4"  (1.626 m)     Head Cir --      Peak Flow --      Pain Score --      Pain Loc --      Pain Edu? --      Excl. in [place]? --      Constitutional: Alert and oriented. Well appearing and in no distress. Eyes: Conjunctivae are normal. PERRL. Normal extraocular movements. ENT   Head: Normocephalic and atraumatic.   Nose: No congestion/rhinnorhea.   Mouth/Throat: Mucous membranes are moist.   Neck: No stridor. Cardiovascular/Chest: Normal rate, regular rhythm.  No murmurs, rubs, or gallops. Respiratory: Normal respiratory effort without tachypnea nor retractions. Breath sounds are clear and equal bilaterally. No wheezes/rales/rhonchi. Gastrointestinal: Soft. No distention, no guarding, no rebound. Nontender.    Genitourinary/rectal:Deferred Musculoskeletal: Nontender with normal range of motion in all extremities. No joint effusions.  No lower extremity tenderness.  No edema. Neurologic:  Normal speech and language. No gross or focal neurologic deficits are appreciated. Skin:  Skin is warm, dry and intact. No rash noted. Psychiatric: Mildly anxious. Mood and affect are normal. Speech and behavior are normal. Patient exhibits appropriate insight and judgment.  ____________________________________________   EKG I, Lisa Roca, MD, the attending physician have personally viewed and interpreted all ECGs.  62 bpm. Normal sinus rhythm. Narrow QRS.  Normal axis. Normal ST and T-wave ____________________________________________  LABS (pertinent positives/negatives)  CBC within normal limits Basic metabolic panel within normal limits Carboxyhemoglobin 1.7 PH 7.44, CO2 36, PO2  79  ____________________________________________  RADIOLOGY All Xrays were viewed by me. Imaging interpreted by Radiologist.  CT head noncontrast: No acute intracranial abnormalities  Chest two-view: Negative __________________________________________  PROCEDURES  Procedure(s) performed: None  Critical Care performed: None  ____________________________________________   ED COURSE / ASSESSMENT AND PLAN  Pertinent labs & imaging results that were available during my care of the patient were reviewed by me and considered in my medical decision making (see chart for details).    Patient is here to be evaluated for chronic headaches over the past several months and she is a little concerned that she could be experiencing carbon monoxide poisoning. Currently she doesn't have a headache. Her carboxyhemoglobin level is normal. In talking with her further, it sounds like may be stress is contributing to tension headaches. Symptoms do not seem consistent with temporal arteritis. We did discuss  neuroimaging, and did a head CT and this was negative for any acute source or cause for headache.  She was instructed to replace her car monoxide detector.  She will follow-up with a primary care physician.    CONSULTATIONS:   None   Patient / Family / Caregiver informed of clinical course, medical decision-making process, and agree with plan.   I discussed return precautions, follow-up instructions, and discharged instructions with patient and/or family.   ___________________________________________   FINAL CLINICAL IMPRESSION(S) / ED DIAGNOSES   Final diagnoses:  Generalized headaches              Note: This dictation was prepared with Dragon dictation. Any transcriptional errors that result from this process are unintentional   Lisa Roca, MD 01/09/16 2120

## 2016-01-09 NOTE — ED Notes (Signed)
Pt ambulatory to triage.  Pt reports appro 2 weeks ago pt's carbon monoxide detector went off in the house.  Pt has intermittent h/a's.  Pt has not seen a doctor since the heating furnace has been turned off and pt is still in the same house.  Pt wants to be checked for poisoning.  Pt alert.  Speech clear.

## 2016-01-17 DIAGNOSIS — D1800 Hemangioma unspecified site: Secondary | ICD-10-CM | POA: Diagnosis not present

## 2016-01-17 DIAGNOSIS — L570 Actinic keratosis: Secondary | ICD-10-CM | POA: Diagnosis not present

## 2016-01-17 DIAGNOSIS — L812 Freckles: Secondary | ICD-10-CM | POA: Diagnosis not present

## 2016-01-17 DIAGNOSIS — Z1283 Encounter for screening for malignant neoplasm of skin: Secondary | ICD-10-CM | POA: Diagnosis not present

## 2016-01-17 DIAGNOSIS — Z85828 Personal history of other malignant neoplasm of skin: Secondary | ICD-10-CM | POA: Diagnosis not present

## 2016-01-17 DIAGNOSIS — L821 Other seborrheic keratosis: Secondary | ICD-10-CM | POA: Diagnosis not present

## 2016-01-17 DIAGNOSIS — L578 Other skin changes due to chronic exposure to nonionizing radiation: Secondary | ICD-10-CM | POA: Diagnosis not present

## 2016-01-28 ENCOUNTER — Encounter: Payer: Self-pay | Admitting: Internal Medicine

## 2016-01-28 ENCOUNTER — Ambulatory Visit (INDEPENDENT_AMBULATORY_CARE_PROVIDER_SITE_OTHER): Payer: PPO | Admitting: Internal Medicine

## 2016-01-28 VITALS — BP 129/67 | HR 54 | Temp 97.8°F | Ht 64.0 in | Wt 173.0 lb

## 2016-01-28 DIAGNOSIS — Z23 Encounter for immunization: Secondary | ICD-10-CM | POA: Diagnosis not present

## 2016-01-28 DIAGNOSIS — F4323 Adjustment disorder with mixed anxiety and depressed mood: Secondary | ICD-10-CM | POA: Insufficient documentation

## 2016-01-28 DIAGNOSIS — E785 Hyperlipidemia, unspecified: Secondary | ICD-10-CM | POA: Diagnosis not present

## 2016-01-28 DIAGNOSIS — E559 Vitamin D deficiency, unspecified: Secondary | ICD-10-CM

## 2016-01-28 DIAGNOSIS — I10 Essential (primary) hypertension: Secondary | ICD-10-CM

## 2016-01-28 LAB — VITAMIN D 25 HYDROXY (VIT D DEFICIENCY, FRACTURES): VITD: 37.49 ng/mL (ref 30.00–100.00)

## 2016-01-28 LAB — COMPREHENSIVE METABOLIC PANEL
ALBUMIN: 4.3 g/dL (ref 3.5–5.2)
ALK PHOS: 62 U/L (ref 39–117)
ALT: 17 U/L (ref 0–35)
AST: 20 U/L (ref 0–37)
BUN: 22 mg/dL (ref 6–23)
CO2: 30 mEq/L (ref 19–32)
CREATININE: 1.01 mg/dL (ref 0.40–1.20)
Calcium: 9.3 mg/dL (ref 8.4–10.5)
Chloride: 104 mEq/L (ref 96–112)
GFR: 55.27 mL/min — ABNORMAL LOW (ref >60.00)
Glucose, Bld: 96 mg/dL (ref 70–99)
POTASSIUM: 4.1 meq/L (ref 3.5–5.1)
SODIUM: 140 meq/L (ref 135–145)
TOTAL PROTEIN: 6.6 g/dL (ref 6.0–8.3)
Total Bilirubin: 0.4 mg/dL (ref 0.2–1.2)

## 2016-01-28 NOTE — Addendum Note (Signed)
Addended by: Vernetta Honey on: 01/28/2016 08:27 AM   Modules accepted: Orders

## 2016-01-28 NOTE — Assessment & Plan Note (Signed)
Reviewed recent ER notes. Significant stress likely contributing to headache and other symptoms. Encouraged her to consider counseling. She will let us know if she wants to proceed with this.

## 2016-01-28 NOTE — Assessment & Plan Note (Signed)
BP Readings from Last 3 Encounters:  01/28/16 129/67  01/09/16 127/73  07/23/15 124/68   BP well controlled. Renal function with labs. Continue Metoprolol and Maxide.

## 2016-01-28 NOTE — Assessment & Plan Note (Signed)
Will check LFTs with labs. Continue Crestor. 

## 2016-01-28 NOTE — Progress Notes (Signed)
Pre visit review using our clinic review tool, if applicable. No additional management support is needed unless otherwise documented below in the visit note. 

## 2016-01-28 NOTE — Progress Notes (Signed)
Subjective:    Patient ID: Jessica Reynolds, female    DOB: May 13, 1928, 82 y.o.   MRN: [id number]  HPI  82YO female presents for follow up.  HTN - Compliant with medication. No CP, HA.  Seen in ED 1/25 for headache and slurred speech. CO detector had gone off at home. Had furnace replaced. Symptoms attributed to stress. CT head and CXR were normal. Labs normal. Also has stress of handicap son living with her. Working out care plan for him with other children. Trying to sell former home in [place]. Notes significant stress with this.   Wt Readings from Last 3 Encounters:  01/28/16 173 lb (78.472 kg)  01/09/16 165 lb (74.844 kg)  07/23/15 175 lb 9.6 oz (79.652 kg)   BP Readings from Last 3 Encounters:  01/28/16 129/67  01/09/16 127/73  07/23/15 124/68    Past Medical History  Diagnosis Date  . Arthritis   . Colon polyp   . UTI (lower urinary tract infection)    Family History  Problem Relation Age of Onset  . Stroke Mother   . Heart disease Father   . Macular degeneration Sister   . Diabetes Sister   . Breast cancer Neg Hx    Past Surgical History  Procedure Laterality Date  . Ectopic pregnancy surgery  1960  . Achilles tendon surgery Right 1990  . Achilles tendon surgery Left 1997  . Vaginal delivery      4  . Abdominal hysterectomy  1980    BSO, fibroids   Social History   Social History  . Marital Status: Unknown    Spouse Name: N/A  . Number of Children: N/A  . Years of Education: N/A   Social History Main Topics  . Smoking status: Never Smoker   . Smokeless tobacco: Never Used  . Alcohol Use: Yes     Comment: social  . Drug Use: No  . Sexual Activity: Not Asked   Other Topics Concern  . None   Social History Narrative   Born in [place], [place].      Lives in [place] with son. Husband passed away 04-10-12. No pets.      Work - retired for [employer] - regular      Exercise - tennis,  3-5 days per week    Review of  Systems  Constitutional: Negative for fever, chills, appetite change, fatigue and unexpected weight change.  Eyes: Negative for visual disturbance.  Respiratory: Negative for shortness of breath.   Cardiovascular: Negative for chest pain and leg swelling.  Gastrointestinal: Negative for nausea, vomiting, abdominal pain, diarrhea and constipation.  Musculoskeletal: Negative for myalgias and arthralgias.  Skin: Negative for color change and rash.  Hematological: Negative for adenopathy. Does not bruise/bleed easily.  Psychiatric/Behavioral: Positive for dysphoric mood. Negative for decreased concentration. The patient is nervous/anxious.        Objective:    BP 129/67 mmHg  Pulse 54  Temp(Src) 97.8 F (36.6 C) (Oral)  Ht 5\' 4"  (1.626 m)  Wt 173 lb (78.472 kg)  BMI 29.68 kg/m2  SpO2 95% Physical Exam  Constitutional: She is oriented to person, place, and time. She appears well-developed and well-nourished. No distress.  HENT:  Head: Normocephalic and atraumatic.  Right Ear: External ear normal.  Left Ear: External ear normal.  Nose: Nose normal.  Mouth/Throat: Oropharynx is clear and moist. No oropharyngeal exudate.  Eyes: Conjunctivae are normal. Pupils are equal, round, and  reactive to light. Right eye exhibits no discharge. Left eye exhibits no discharge. No scleral icterus.  Neck: Normal range of motion. Neck supple. No tracheal deviation present. No thyromegaly present.  Cardiovascular: Normal rate, regular rhythm, normal heart sounds and intact distal pulses.  Exam reveals no gallop and no friction rub.   No murmur heard. Pulmonary/Chest: Effort normal and breath sounds normal. No respiratory distress. She has no wheezes. She has no rales. She exhibits no tenderness.  Musculoskeletal: Normal range of motion. She exhibits no edema or tenderness.  Lymphadenopathy:    She has no cervical adenopathy.  Neurological: She is alert and oriented to person, place, and time. No cranial  nerve deficit. She exhibits normal muscle tone. Coordination normal.  Skin: Skin is warm and dry. No rash noted. She is not diaphoretic. No erythema. No pallor.  Psychiatric: She has a normal mood and affect. Her behavior is normal. Judgment and thought content normal.          Assessment & Plan:   Problem List Items Addressed This Visit      Unprioritized   Adjustment disorder with mixed anxiety and depressed mood    Reviewed recent ER notes. Significant stress likely contributing to headache and other symptoms. Encouraged her to consider counseling. She will let us know if she wants to proceed with this.      Benign essential HTN - Primary    BP Readings from Last 3 Encounters:  01/28/16 129/67  01/09/16 127/73  07/23/15 124/68   BP well controlled. Renal function with labs. Continue Metoprolol and Maxide.      Relevant Orders   Comprehensive metabolic panel   HLD (hyperlipidemia)    Will check LFTs with labs. Continue Crestor.       Other Visit Diagnoses    Vitamin D deficiency        Relevant Orders    Vitamin D (25 hydroxy)        Return in about 6 months (around 07/27/2016) for Physical.

## 2016-01-28 NOTE — Patient Instructions (Addendum)
Let us know if you are interested in speaking with a counselor.  Labs today.  Prevnar vaccine today.

## 2016-01-29 ENCOUNTER — Telehealth: Payer: Self-pay

## 2016-01-29 NOTE — Telephone Encounter (Signed)
Pt states that Almyra Free called her for test results and she was calling back to find out. Pt did not answer the phone.

## 2016-02-08 ENCOUNTER — Other Ambulatory Visit: Payer: Self-pay | Admitting: Internal Medicine

## 2016-02-08 NOTE — Telephone Encounter (Signed)
Sent to the pharmacy by e-scribe.  Pt to return in 6 months.

## 2016-03-04 DIAGNOSIS — H353131 Nonexudative age-related macular degeneration, bilateral, early dry stage: Secondary | ICD-10-CM | POA: Diagnosis not present

## 2016-03-04 DIAGNOSIS — H2513 Age-related nuclear cataract, bilateral: Secondary | ICD-10-CM | POA: Diagnosis not present

## 2016-07-18 DIAGNOSIS — R300 Dysuria: Secondary | ICD-10-CM | POA: Diagnosis not present

## 2016-07-29 ENCOUNTER — Encounter: Payer: PPO | Admitting: Internal Medicine

## 2016-08-05 ENCOUNTER — Other Ambulatory Visit: Payer: Self-pay

## 2016-08-05 MED ORDER — ROSUVASTATIN CALCIUM 5 MG PO TABS: ORAL_TABLET | ORAL | 5 refills | Status: DC

## 2016-08-05 NOTE — Telephone Encounter (Signed)
Last OV was 01/28/2016 with Gilford Rile, no follow up scheduled.  Last Labs were 07/23/2015 with lipids.  Please advise refill on lipitor. thanks

## 2016-08-12 DIAGNOSIS — E7800 Pure hypercholesterolemia, unspecified: Secondary | ICD-10-CM | POA: Diagnosis not present

## 2016-08-12 DIAGNOSIS — I10 Essential (primary) hypertension: Secondary | ICD-10-CM | POA: Diagnosis not present

## 2016-08-12 DIAGNOSIS — Z131 Encounter for screening for diabetes mellitus: Secondary | ICD-10-CM | POA: Diagnosis not present

## 2016-08-12 DIAGNOSIS — Z0000 Encounter for general adult medical examination without abnormal findings: Secondary | ICD-10-CM | POA: Diagnosis not present

## 2016-08-12 DIAGNOSIS — Z1239 Encounter for other screening for malignant neoplasm of breast: Secondary | ICD-10-CM | POA: Diagnosis not present

## 2016-09-08 DIAGNOSIS — H353131 Nonexudative age-related macular degeneration, bilateral, early dry stage: Secondary | ICD-10-CM | POA: Diagnosis not present

## 2016-10-02 ENCOUNTER — Other Ambulatory Visit: Payer: Self-pay

## 2016-10-02 MED ORDER — FLUTICASONE PROPIONATE 50 MCG/ACT NA SUSP: 2.0000 | Freq: Every day | NASAL | 6 refills | Status: DC

## 2016-10-02 MED ORDER — METOPROLOL SUCCINATE ER 50 MG PO TB24: 50.0000 mg | ORAL_TABLET | Freq: Every day | ORAL | 3 refills | Status: DC

## 2016-10-02 MED ORDER — TRIAMTERENE-HCTZ 37.5-25 MG PO TABS: 0.5000 | ORAL_TABLET | Freq: Every day | ORAL | 3 refills | Status: DC

## 2016-10-02 NOTE — Telephone Encounter (Signed)
Patient is a former patient of Gilford Rile, no follow up scheduled, please advise.  Last seen in February 2017. thanks

## 2016-10-08 ENCOUNTER — Other Ambulatory Visit: Payer: Self-pay | Admitting: Internal Medicine

## 2016-10-08 DIAGNOSIS — Z1231 Encounter for screening mammogram for malignant neoplasm of breast: Secondary | ICD-10-CM

## 2016-11-11 ENCOUNTER — Ambulatory Visit: Payer: PPO | Attending: Internal Medicine

## 2016-11-24 DIAGNOSIS — E7800 Pure hypercholesterolemia, unspecified: Secondary | ICD-10-CM | POA: Diagnosis not present

## 2016-11-24 DIAGNOSIS — R7303 Prediabetes: Secondary | ICD-10-CM | POA: Diagnosis not present

## 2016-11-24 DIAGNOSIS — N183 Chronic kidney disease, stage 3 (moderate): Secondary | ICD-10-CM | POA: Diagnosis not present

## 2016-11-24 DIAGNOSIS — M150 Primary generalized (osteo)arthritis: Secondary | ICD-10-CM | POA: Diagnosis not present

## 2016-11-24 DIAGNOSIS — M159 Polyosteoarthritis, unspecified: Secondary | ICD-10-CM | POA: Insufficient documentation

## 2016-11-24 DIAGNOSIS — I10 Essential (primary) hypertension: Secondary | ICD-10-CM | POA: Diagnosis not present

## 2016-12-13 DIAGNOSIS — N390 Urinary tract infection, site not specified: Secondary | ICD-10-CM | POA: Diagnosis not present

## 2016-12-13 DIAGNOSIS — R300 Dysuria: Secondary | ICD-10-CM | POA: Diagnosis not present

## 2016-12-22 ENCOUNTER — Ambulatory Visit
Admission: RE | Admit: 2016-12-22 | Discharge: 2016-12-22 | Disposition: A | Discharge status: Home / Self Care | Payer: PPO | Source: Ambulatory Visit | Attending: Internal Medicine | Admitting: Internal Medicine

## 2016-12-22 DIAGNOSIS — Z1231 Encounter for screening mammogram for malignant neoplasm of breast: Secondary | ICD-10-CM | POA: Diagnosis not present

## 2016-12-29 IMAGING — CR DG CHEST 2V
2 series · 2 of 2 positions shown · non-contrast
Comparison: None.

CLINICAL DATA: Shortness of breath and headaches for 2 weeks.
Initial encounter.

EXAM:
CHEST  2 VIEW

[chest pa]
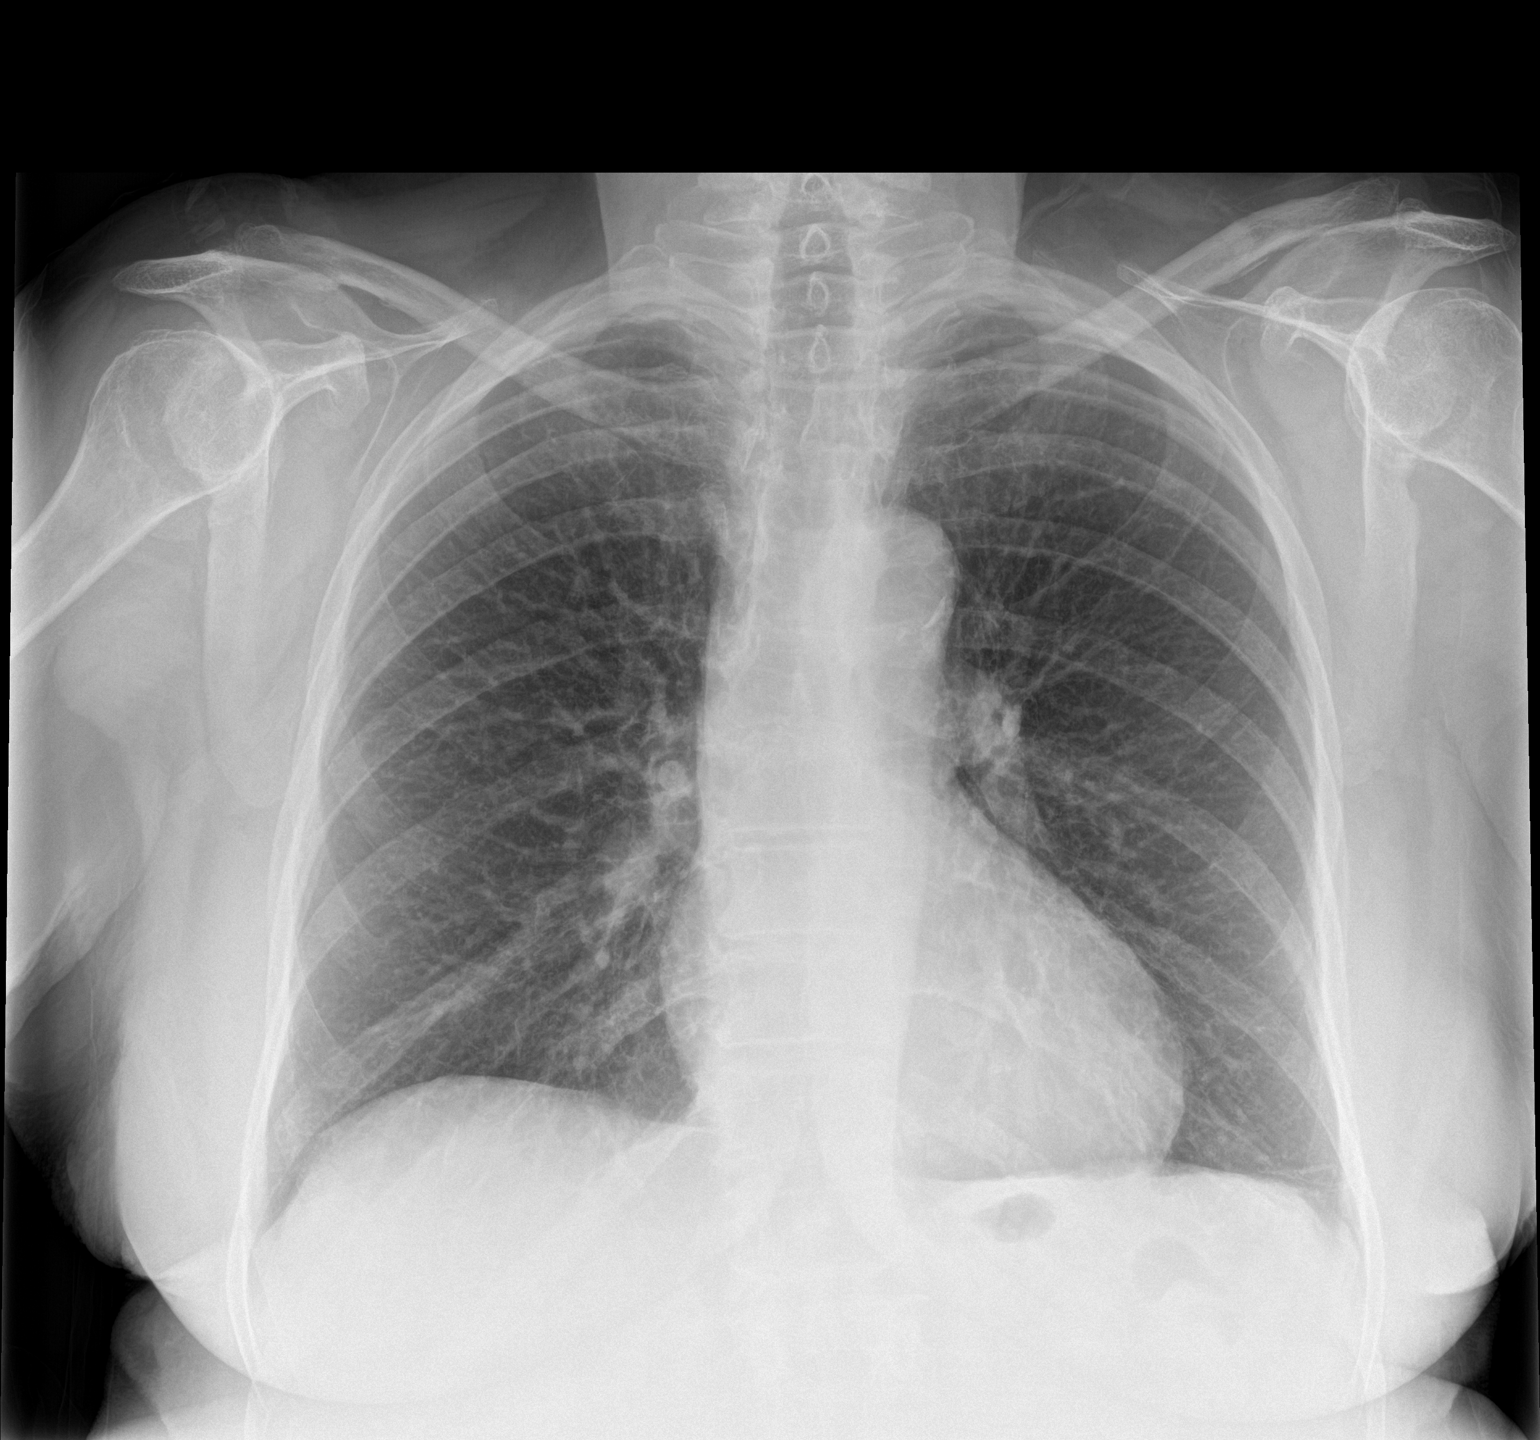

[chest lat]
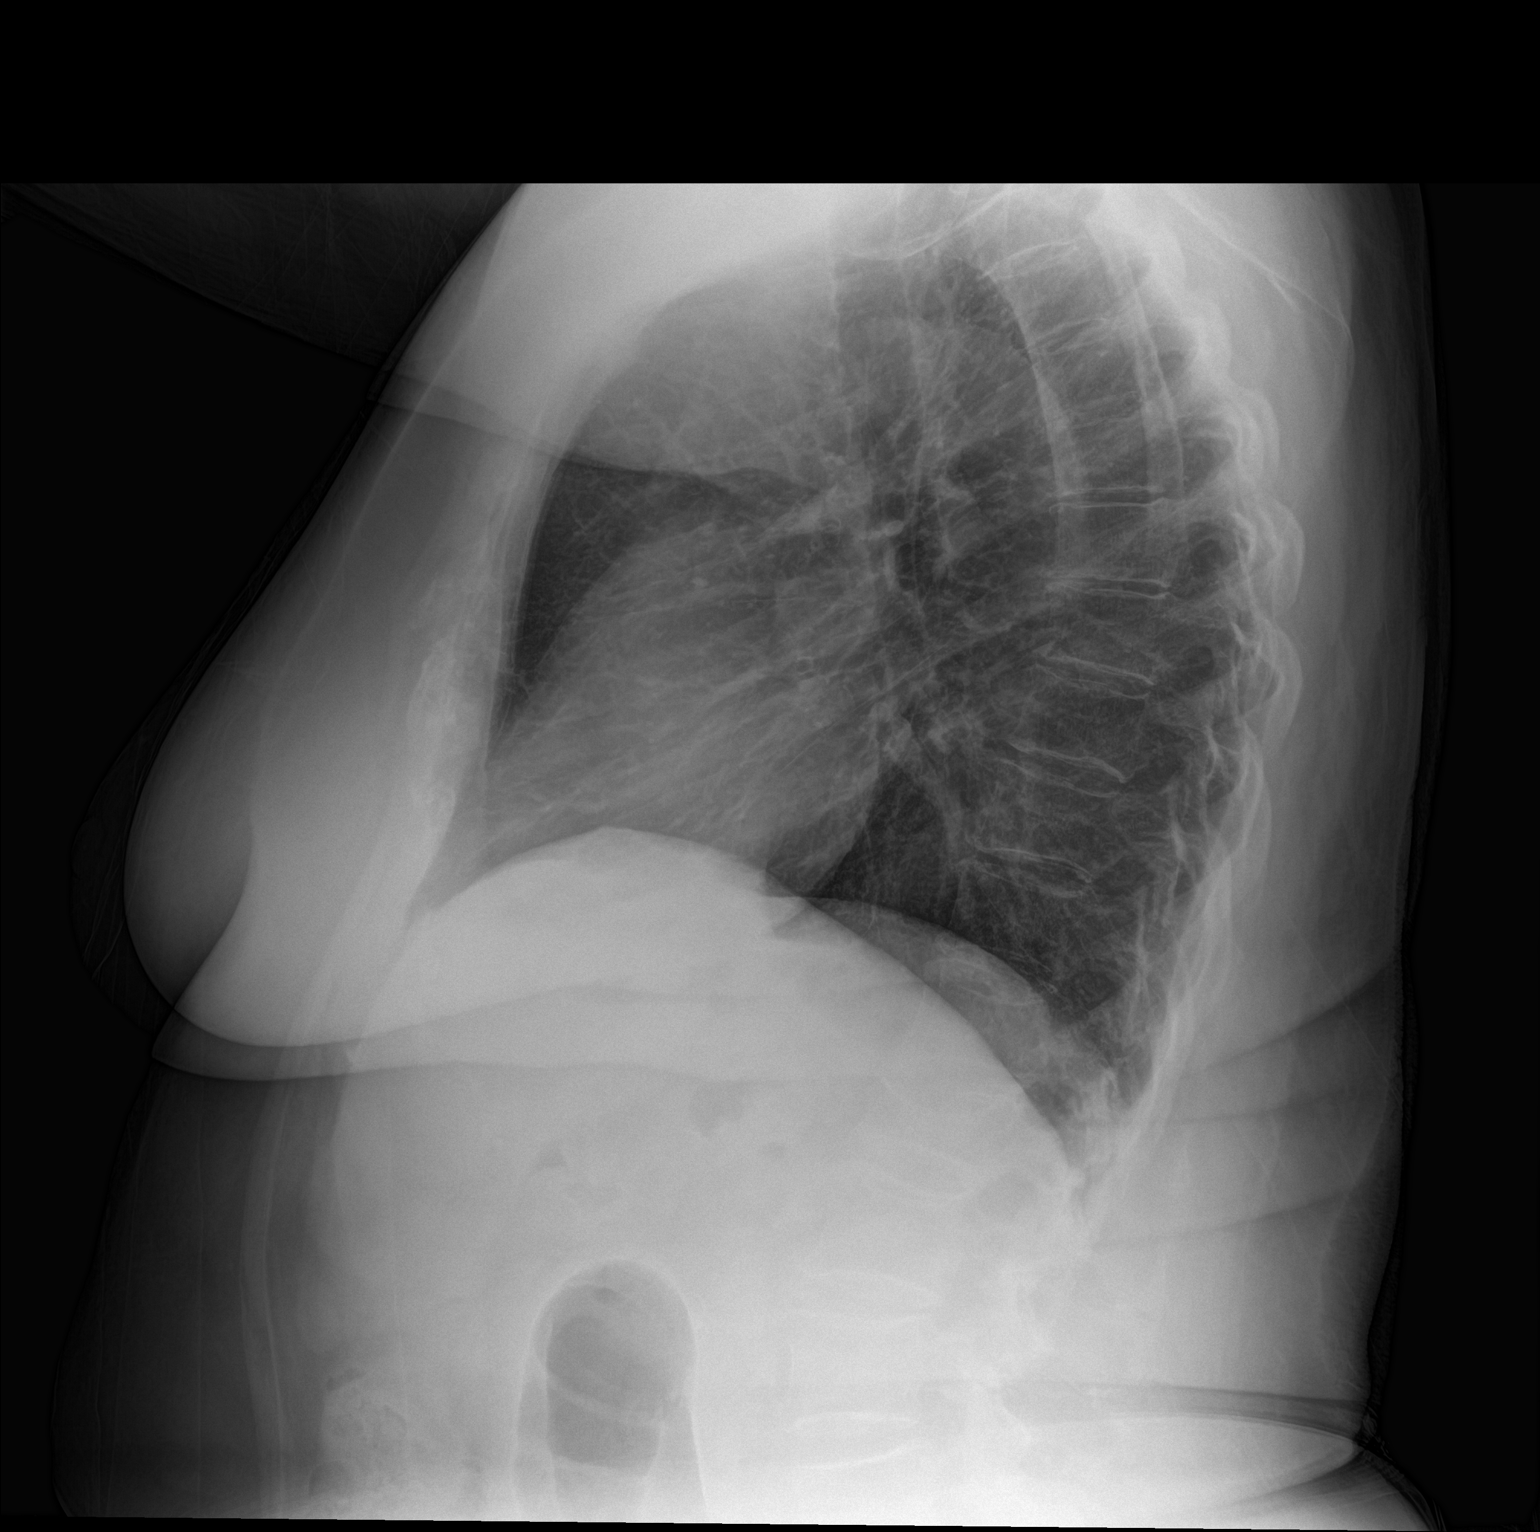

[2 of 2 positions shown; findings below may reference images not displayed]

FINDINGS: The lungs are clear. Heart size is normal. There is no pneumothorax
or pleural effusion. No focal bony abnormality.
IMPRESSION: Negative chest.

## 2016-12-29 IMAGING — CT CT HEAD W/O CM
1 series · 16 of 30 positions shown, 20 images · non-contrast
Comparison: None.

CLINICAL DATA: Headaches. Carbon monoxide detector went off in the
house 2 weeks ago.

EXAM:
CT HEAD WITHOUT CONTRAST
TECHNIQUE: Contiguous axial images were obtained from the base of the skull
through the vertex without intravenous contrast.

[Series 2: head wo · axial · 0.43mm/px · z∈[+483,+627]mm · 16 of 36 slices shown, 20 images]
[im 2/36  brain]
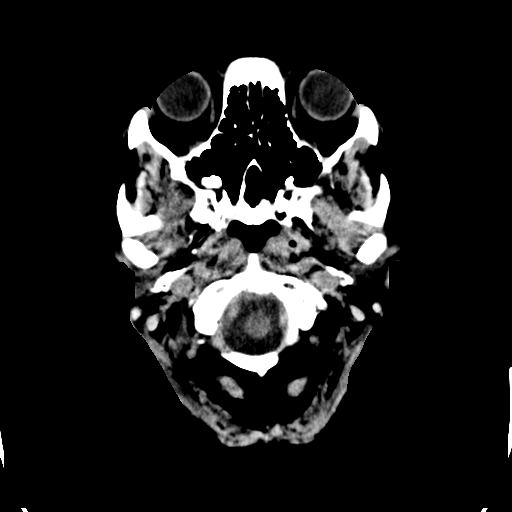
[im 2/36  bone]
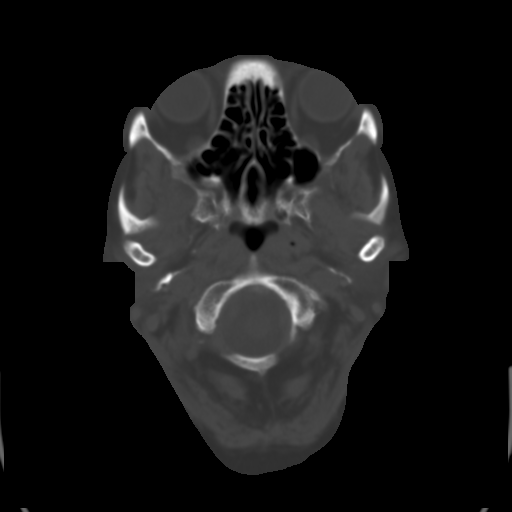
[im 4/36  brain]
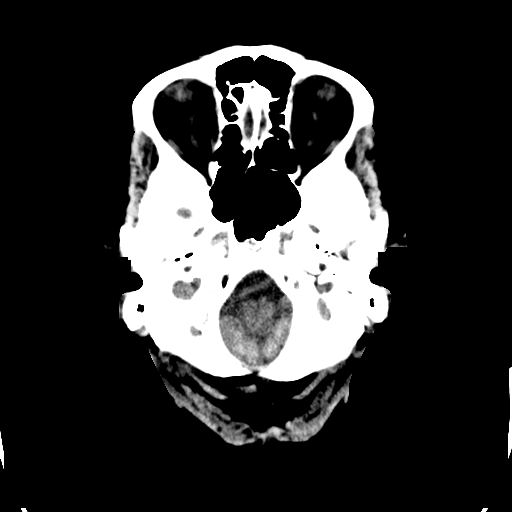
[im 7/36  brain]
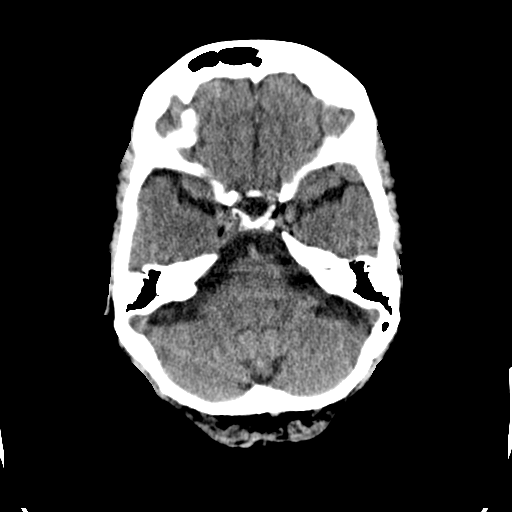
[im 9/36  brain]
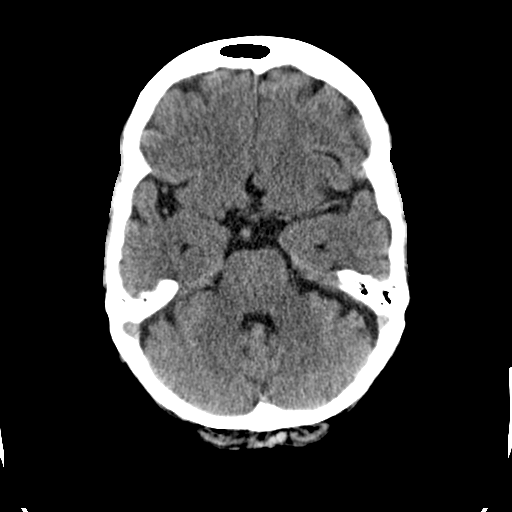
[im 10/36  brain]
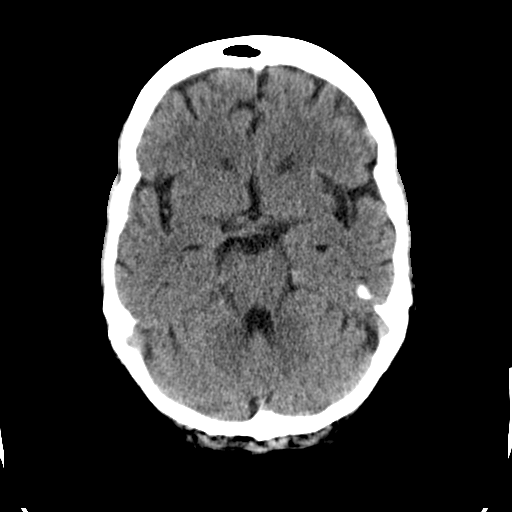
[im 10/36  bone]
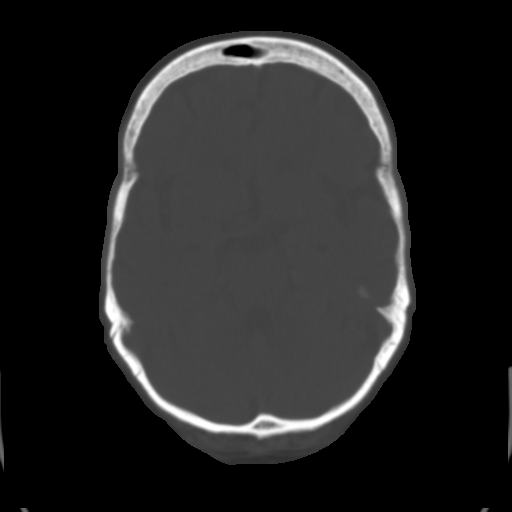
[im 13/36  brain]
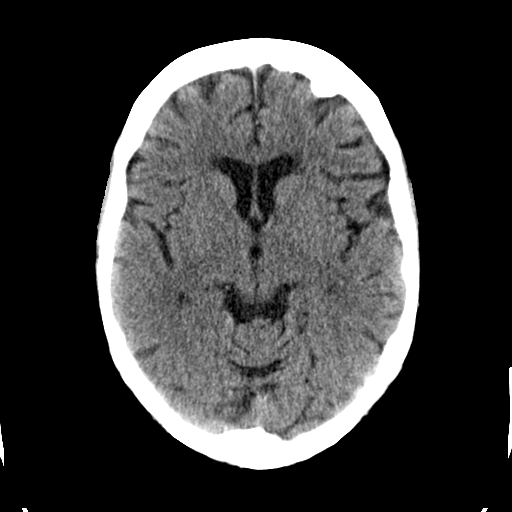
[im 15/36  brain]
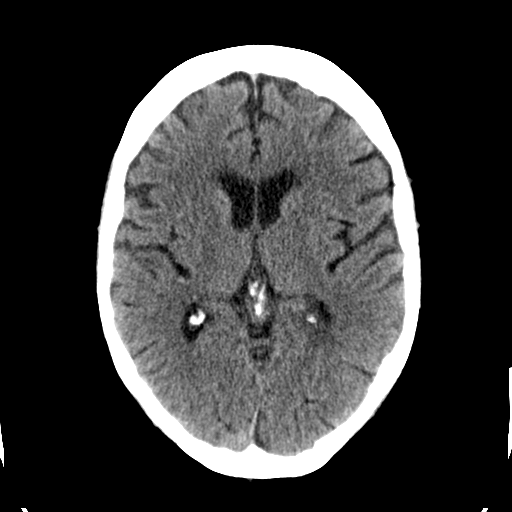
[im 17/36  brain]
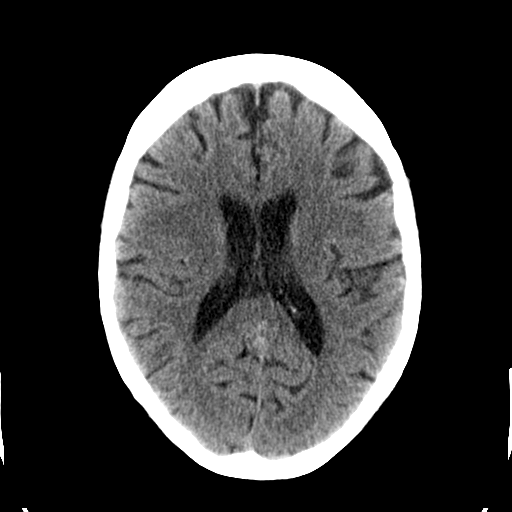
[im 19/36  brain]
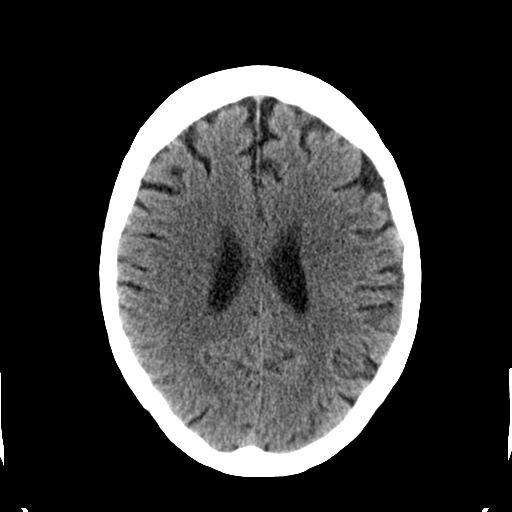
[im 19/36  bone]
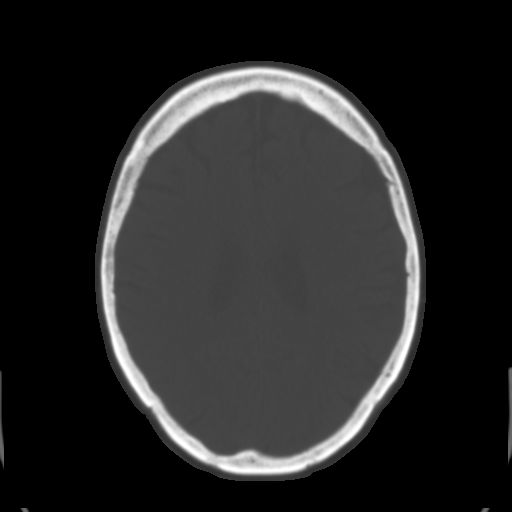
[im 21/36  brain]
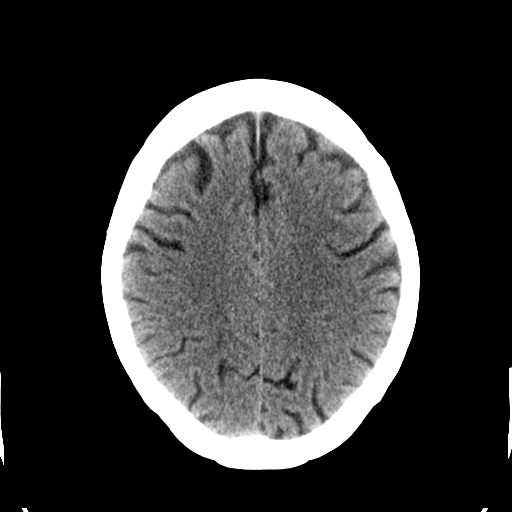
[im 23/36  brain]
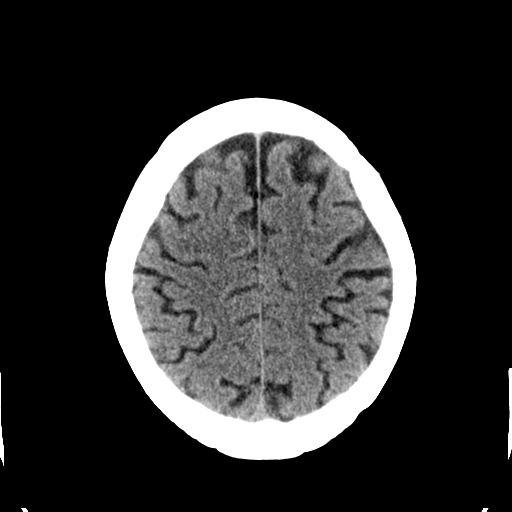
[im 26/36  brain]
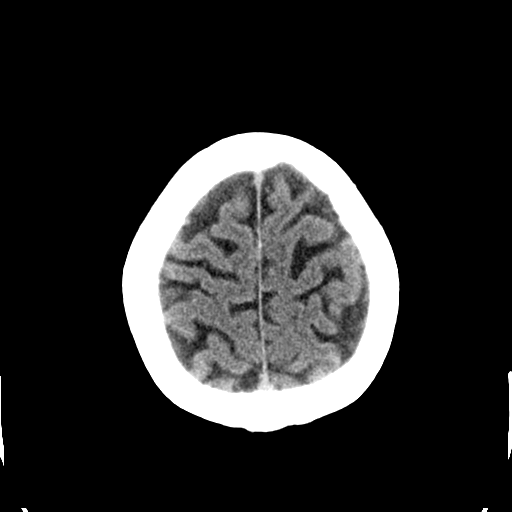
[im 27/36  brain]
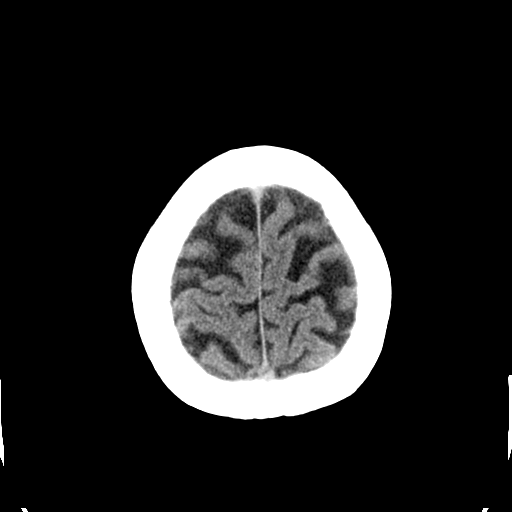
[im 27/36  bone]
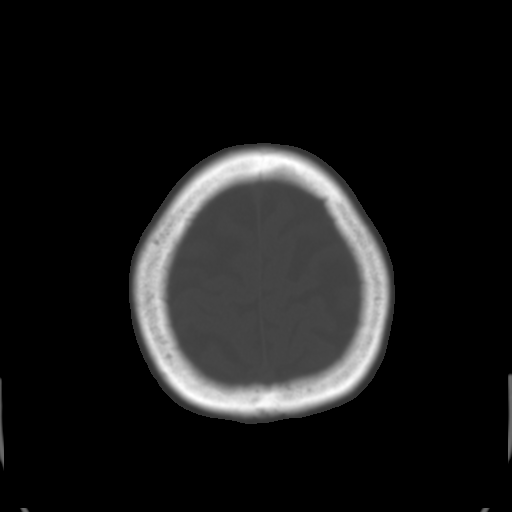
[im 29/36  brain]
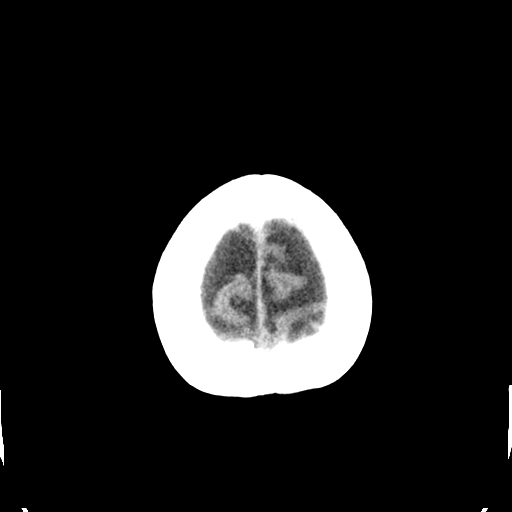
[im 32/36  brain]
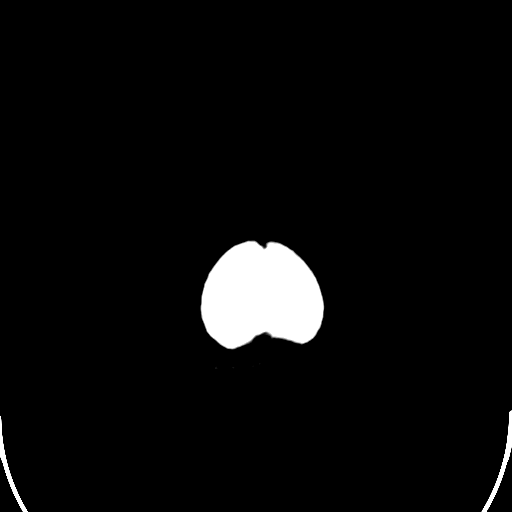
[im 34/36  brain]
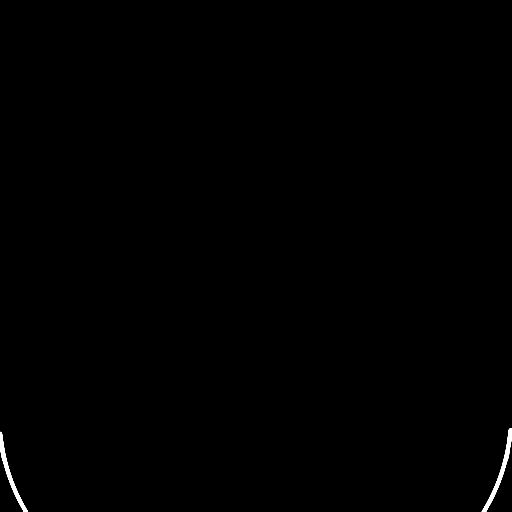

[16 of 30 positions shown; findings below may reference images not displayed]

FINDINGS: Mild cerebral atrophy. No significant ventricular dilatation. Mild
patchy low-attenuation changes in the deep white matter consistent
small vessel ischemia. No mass effect or midline shift. No abnormal
extra-axial fluid collections. Gray-white matter junctions are
distinct. Basal cisterns are not effaced. No evidence of acute
intracranial hemorrhage. No depressed skull fractures. Visualized
paranasal sinuses and mastoid air cells are not opacified. Vascular
calcifications.
IMPRESSION: No acute intracranial abnormalities.

## 2017-01-19 DIAGNOSIS — L570 Actinic keratosis: Secondary | ICD-10-CM | POA: Diagnosis not present

## 2017-01-19 DIAGNOSIS — Z85828 Personal history of other malignant neoplasm of skin: Secondary | ICD-10-CM | POA: Diagnosis not present

## 2017-01-19 DIAGNOSIS — L578 Other skin changes due to chronic exposure to nonionizing radiation: Secondary | ICD-10-CM | POA: Diagnosis not present

## 2017-01-19 DIAGNOSIS — L812 Freckles: Secondary | ICD-10-CM | POA: Diagnosis not present

## 2017-01-19 DIAGNOSIS — D692 Other nonthrombocytopenic purpura: Secondary | ICD-10-CM | POA: Diagnosis not present

## 2017-01-19 DIAGNOSIS — Z1283 Encounter for screening for malignant neoplasm of skin: Secondary | ICD-10-CM | POA: Diagnosis not present

## 2017-01-19 DIAGNOSIS — D1800 Hemangioma unspecified site: Secondary | ICD-10-CM | POA: Diagnosis not present

## 2017-01-19 DIAGNOSIS — L821 Other seborrheic keratosis: Secondary | ICD-10-CM | POA: Diagnosis not present

## 2017-01-19 DIAGNOSIS — D229 Melanocytic nevi, unspecified: Secondary | ICD-10-CM | POA: Diagnosis not present

## 2017-01-28 ENCOUNTER — Telehealth: Payer: Self-pay | Admitting: Internal Medicine

## 2017-01-28 NOTE — Telephone Encounter (Signed)
I called pt and left a vm to call the office to let us know if pt would like to continue care with one of the other providers. Thank you!

## 2017-01-30 ENCOUNTER — Other Ambulatory Visit: Payer: Self-pay | Admitting: Family Medicine

## 2017-03-09 ENCOUNTER — Encounter: Payer: Self-pay | Admitting: Family Medicine

## 2017-03-09 ENCOUNTER — Ambulatory Visit (INDEPENDENT_AMBULATORY_CARE_PROVIDER_SITE_OTHER): Payer: PPO | Admitting: Family Medicine

## 2017-03-09 DIAGNOSIS — E785 Hyperlipidemia, unspecified: Secondary | ICD-10-CM | POA: Diagnosis not present

## 2017-03-09 DIAGNOSIS — I10 Essential (primary) hypertension: Secondary | ICD-10-CM

## 2017-03-09 DIAGNOSIS — H353131 Nonexudative age-related macular degeneration, bilateral, early dry stage: Secondary | ICD-10-CM | POA: Diagnosis not present

## 2017-03-09 DIAGNOSIS — R002 Palpitations: Secondary | ICD-10-CM | POA: Insufficient documentation

## 2017-03-09 DIAGNOSIS — R7303 Prediabetes: Secondary | ICD-10-CM | POA: Insufficient documentation

## 2017-03-09 NOTE — Patient Instructions (Signed)
Try crestor 4x/week.  I'll see you in 3 months.  No need to worry.  Take care  Dr. Lacinda Axon

## 2017-03-09 NOTE — Assessment & Plan Note (Signed)
Advised increase in crestor to 3-4 times weekly if tolerated.

## 2017-03-09 NOTE — Progress Notes (Signed)
   Subjective:  Patient ID: Jessica Reynolds, female    DOB: 04-05-1928  Age: 83 y.o. MRN: [id number]  CC: Follow up/establish with me  HPI:  83 year old female with hypertension and hyperlipidemia presents for follow-up.  HTN  At goal on metoprolol and triamterene/HCTZ.  Hyperlipidemia  Currently taking Crestor 2 times a week due to myalgias.  Most recent LDL was 122.  Will discuss increase today.  Palpitations  Patient reports recent palpitations.  She has had these previously.  Seem to be occurring more often as of late.  Reports ongoing stress/stressors.  Compliant with Metoprolol.   Social Hx   Social History   Social History  . Marital status: Unknown    Spouse name: N/A  . Number of children: N/A  . Years of education: N/A   Social History Main Topics  . Smoking status: Never Smoker  . Smokeless tobacco: Never Used  . Alcohol use Yes     Comment: social  . Drug use: No  . Sexual activity: Not Asked   Other Topics Concern  . None   Social History Narrative   Born in [place], [place].      Lives in [place] with son. Husband passed away 03/14/2012. No pets.      Work - retired for [employer] - regular      Exercise - tennis,  3-5 days per week    Review of Systems  Constitutional: Negative.   Cardiovascular: Positive for palpitations.    Objective:  BP 128/74   Pulse (!) 58   Temp 97.7 F (36.5 C) (Oral)   Ht 5\' 4"  (1.626 m)   Wt 169 lb (76.7 kg)   SpO2 93%   BMI 29.01 kg/m   BP/Weight 03/09/2017 01/28/2016 9/38/1017  Systolic BP 510 258 527  Diastolic BP 74 67 73  Wt. (Lbs) 169 173 165  BMI 29.01 29.68 28.31    Physical Exam  Constitutional: She is oriented to person, place, and time. She appears well-developed. No distress.  Cardiovascular: Normal rate and regular rhythm.   Pulmonary/Chest: Effort normal and breath sounds normal.  Neurological: She is alert and oriented to person, place, and time.    Psychiatric: She has a normal mood and affect.  Vitals reviewed.   Lab Results  Component Value Date   WBC 6.0 01/09/2016   HGB 14.0 01/09/2016   HCT 42.7 01/09/2016   PLT 174 01/09/2016   GLUCOSE 96 01/28/2016   CHOL 238 (H) 07/23/2015   TRIG 156.0 (H) 07/23/2015   HDL 51.30 07/23/2015   LDLCALC 156 (H) 07/23/2015   ALT 17 01/28/2016   AST 20 01/28/2016   NA 140 01/28/2016   K 4.1 01/28/2016   CL 104 01/28/2016   CREATININE 1.01 01/28/2016   BUN 22 01/28/2016   CO2 30 01/28/2016   MICROALBUR <0.7 07/23/2015    Assessment & Plan:   Problem List Items Addressed This Visit    Benign essential HTN    Stable. Continue metoprolol and triamterene/HCTZ.      HLD (hyperlipidemia)    Advised increase in crestor to 3-4 times weekly if tolerated.      Palpitations    Likely PVC's/PAC's. Discussed holter and patient declined. Continue metoprolol.          No orders of the defined types were placed in this encounter.    Follow-up: No Follow-up on file.  [place]

## 2017-03-09 NOTE — Assessment & Plan Note (Signed)
Likely PVC's/PAC's. Discussed holter and patient declined. Continue metoprolol.

## 2017-03-09 NOTE — Progress Notes (Signed)
Pre visit review using our clinic review tool, if applicable. No additional management support is needed unless otherwise documented below in the visit note. 

## 2017-03-09 NOTE — Assessment & Plan Note (Signed)
Stable. Continue metoprolol and triamterene/HCTZ.

## 2017-03-23 ENCOUNTER — Telehealth: Payer: Self-pay | Admitting: Family Medicine

## 2017-03-23 ENCOUNTER — Other Ambulatory Visit: Payer: Self-pay | Admitting: Family Medicine

## 2017-03-23 DIAGNOSIS — E2839 Other primary ovarian failure: Secondary | ICD-10-CM

## 2017-03-23 NOTE — Telephone Encounter (Signed)
Orders in 

## 2017-03-23 NOTE — Telephone Encounter (Signed)
Pt was inquiring about having her bone density done. Please advise, thank you!  Call pt @ [phone]

## 2017-03-23 NOTE — Telephone Encounter (Signed)
No orders were placed.

## 2017-04-17 ENCOUNTER — Encounter: Payer: Self-pay | Admitting: Family Medicine

## 2017-05-25 ENCOUNTER — Ambulatory Visit: Payer: PPO

## 2017-06-09 ENCOUNTER — Ambulatory Visit (INDEPENDENT_AMBULATORY_CARE_PROVIDER_SITE_OTHER): Payer: PPO | Admitting: Family Medicine

## 2017-06-09 ENCOUNTER — Encounter: Payer: Self-pay | Admitting: Family Medicine

## 2017-06-09 VITALS — BP 124/68 | HR 53 | Temp 97.6°F | Resp 12 | Wt 168.1 lb

## 2017-06-09 DIAGNOSIS — R7303 Prediabetes: Secondary | ICD-10-CM

## 2017-06-09 DIAGNOSIS — H9190 Unspecified hearing loss, unspecified ear: Secondary | ICD-10-CM | POA: Insufficient documentation

## 2017-06-09 DIAGNOSIS — Z130 Encounter for screening for diseases of the blood and blood-forming organs and certain disorders involving the immune mechanism: Secondary | ICD-10-CM | POA: Diagnosis not present

## 2017-06-09 DIAGNOSIS — E785 Hyperlipidemia, unspecified: Secondary | ICD-10-CM | POA: Diagnosis not present

## 2017-06-09 DIAGNOSIS — I10 Essential (primary) hypertension: Secondary | ICD-10-CM | POA: Diagnosis not present

## 2017-06-09 DIAGNOSIS — H9193 Unspecified hearing loss, bilateral: Secondary | ICD-10-CM

## 2017-06-09 DIAGNOSIS — H3530 Unspecified macular degeneration: Secondary | ICD-10-CM | POA: Insufficient documentation

## 2017-06-09 LAB — CBC
HCT: 43.6 % (ref 36.0–46.0)
Hemoglobin: 14.3 g/dL (ref 12.0–15.0)
MCHC: 32.9 g/dL (ref 30.0–36.0)
MCV: 93.4 fl (ref 78.0–100.0)
PLATELETS: 182 10*3/uL (ref 150.0–400.0)
RBC: 4.66 Mil/uL (ref 3.87–5.11)
RDW: 13.7 % (ref 11.5–15.5)
WBC: 5.8 10*3/uL (ref 4.0–10.5)

## 2017-06-09 LAB — COMPREHENSIVE METABOLIC PANEL
ALBUMIN: 4 g/dL (ref 3.5–5.2)
ALK PHOS: 55 U/L (ref 39–117)
ALT: 13 U/L (ref 0–35)
AST: 18 U/L (ref 0–37)
BILIRUBIN TOTAL: 0.5 mg/dL (ref 0.2–1.2)
BUN: 21 mg/dL (ref 6–23)
CO2: 31 mEq/L (ref 19–32)
CREATININE: 1.02 mg/dL (ref 0.40–1.20)
Calcium: 9.5 mg/dL (ref 8.4–10.5)
Chloride: 105 mEq/L (ref 96–112)
GFR: 54.47 mL/min — ABNORMAL LOW (ref >60.00)
Glucose, Bld: 95 mg/dL (ref 70–99)
Potassium: 4.4 mEq/L (ref 3.5–5.1)
SODIUM: 141 meq/L (ref 135–145)
TOTAL PROTEIN: 6.3 g/dL (ref 6.0–8.3)

## 2017-06-09 LAB — LIPID PANEL
Cholesterol: 188 mg/dL (ref 0–200)
HDL: 45.6 mg/dL (ref >39.00)
LDL Cholesterol: 109 mg/dL — ABNORMAL HIGH (ref 0–99)
NonHDL: 141.98
Total CHOL/HDL Ratio: 4
Triglycerides: 164 mg/dL — ABNORMAL HIGH (ref 0.0–149.0)
VLDL: 32.8 mg/dL (ref 0.0–40.0)

## 2017-06-09 LAB — HEMOGLOBIN A1C: HEMOGLOBIN A1C: 6 % (ref 4.6–6.5)

## 2017-06-09 NOTE — Assessment & Plan Note (Signed)
Stable. Lipid panel today. If LDL is elevated, will increase dosage. Continue crestor.

## 2017-06-09 NOTE — Assessment & Plan Note (Signed)
New problem. Sending to ENT. 

## 2017-06-09 NOTE — Progress Notes (Signed)
Subjective:  Patient ID: Jessica Reynolds, female    DOB: 1928-03-13  Age: 83 y.o. MRN: [id number]  CC: Follow up  HPI:  83 year old female with hypertension, hyperlipidemia, prediabetes presents for follow up.  HTN  At goal on metoprolol and triamterene HCTZ.  Endorses compliance.  HLD  Has been stable Crestor. Needs lipid panel.  Prediabetes  Most recent A1c was 5.9.  Stable.  She needs labs today.  Hearing loss  Patient reports ongoing hearing loss.  She is interested in getting hearing aids.  Social Hx   Social History   Social History  . Marital status: Unknown    Spouse name: N/A  . Number of children: N/A  . Years of education: N/A   Social History Main Topics  . Smoking status: Never Smoker  . Smokeless tobacco: Never Used  . Alcohol use Yes     Comment: social  . Drug use: No  . Sexual activity: Not Asked   Other Topics Concern  . None   Social History Narrative   Born in [place], [place].      Lives in [place] with son. Husband passed away 04/08/12. No pets.      Work - retired for [employer] - regular      Exercise - tennis,  3-5 days per week    Review of Systems  Constitutional: Negative.   HENT: Positive for hearing loss.   Respiratory: Negative.   Cardiovascular: Negative.    Objective:  BP 124/68 (BP Location: Right Arm, Patient Position: Sitting, Cuff Size: Normal)   Pulse (!) 53   Temp 97.6 F (36.4 C) (Oral)   Resp 12   Wt 168 lb 2 oz (76.3 kg)   SpO2 98%   BMI 28.86 kg/m   BP/Weight 06/09/2017 03/09/2017 6/62/9476  Systolic BP 546 503 546  Diastolic BP 68 74 67  Wt. (Lbs) 168.13 169 173  BMI 28.86 29.01 29.68    Physical Exam  Constitutional: She is oriented to person, place, and time. She appears well-developed. No distress.  HENT:  Head: Normocephalic and atraumatic.  Normal TM's bilaterally.   Cardiovascular: Normal rate and regular rhythm.   Pulmonary/Chest: Effort normal and  breath sounds normal.  Neurological: She is alert and oriented to person, place, and time.  Psychiatric: She has a normal mood and affect.  Vitals reviewed.   Lab Results  Component Value Date   WBC 6.0 01/09/2016   HGB 14.0 01/09/2016   HCT 42.7 01/09/2016   PLT 174 01/09/2016   GLUCOSE 96 01/28/2016   CHOL 238 (H) 07/23/2015   TRIG 156.0 (H) 07/23/2015   HDL 51.30 07/23/2015   LDLCALC 156 (H) 07/23/2015   ALT 17 01/28/2016   AST 20 01/28/2016   NA 140 01/28/2016   K 4.1 01/28/2016   CL 104 01/28/2016   CREATININE 1.01 01/28/2016   BUN 22 01/28/2016   CO2 30 01/28/2016   MICROALBUR <0.7 07/23/2015    Assessment & Plan:   Problem List Items Addressed This Visit      Cardiovascular and Mediastinum   Benign essential HTN - Primary    At goal. Continue metoprolol and triamterene HCTZ. Labs today.      Relevant Orders   Comprehensive metabolic panel     Nervous and Auditory   Hearing loss    New problem. Sending to ENT.      Relevant Orders   Ambulatory referral to ENT  Other   HLD (hyperlipidemia)    Stable. Lipid panel today. If LDL is elevated, will increase dosage. Continue crestor.      Relevant Orders   Lipid panel   Prediabetes    A1C today.      Relevant Orders   Hemoglobin A1c    Other Visit Diagnoses    Screening for deficiency anemia       Relevant Orders   CBC     Follow-up: 6 months  [place] DO [facility]

## 2017-06-09 NOTE — Assessment & Plan Note (Signed)
A1C today. 

## 2017-06-09 NOTE — Assessment & Plan Note (Signed)
At goal. Continue metoprolol and triamterene HCTZ. Labs today.

## 2017-06-09 NOTE — Patient Instructions (Signed)
Continue your meds.  We will call with the referral.  Follow up in 6 months.  Take care  Dr. Lacinda Axon

## 2017-06-16 ENCOUNTER — Ambulatory Visit
Admission: RE | Admit: 2017-06-16 | Discharge: 2017-06-16 | Disposition: A | Discharge status: Home / Self Care | Payer: PPO | Source: Ambulatory Visit | Attending: Family Medicine | Admitting: Family Medicine

## 2017-06-16 DIAGNOSIS — M85851 Other specified disorders of bone density and structure, right thigh: Secondary | ICD-10-CM | POA: Diagnosis not present

## 2017-06-16 DIAGNOSIS — M8580 Other specified disorders of bone density and structure, unspecified site: Secondary | ICD-10-CM | POA: Insufficient documentation

## 2017-06-16 DIAGNOSIS — Z13820 Encounter for screening for osteoporosis: Secondary | ICD-10-CM | POA: Diagnosis not present

## 2017-06-16 DIAGNOSIS — E2839 Other primary ovarian failure: Secondary | ICD-10-CM | POA: Diagnosis not present

## 2017-06-19 ENCOUNTER — Other Ambulatory Visit: Payer: Self-pay | Admitting: Family Medicine

## 2017-06-19 MED ORDER — ALENDRONATE SODIUM 70 MG PO TABS: 70.0000 mg | ORAL_TABLET | ORAL | 11 refills | Status: DC

## 2017-07-03 DIAGNOSIS — H903 Sensorineural hearing loss, bilateral: Secondary | ICD-10-CM | POA: Diagnosis not present

## 2017-07-03 DIAGNOSIS — H6981 Other specified disorders of Eustachian tube, right ear: Secondary | ICD-10-CM | POA: Diagnosis not present

## 2017-08-11 DIAGNOSIS — H903 Sensorineural hearing loss, bilateral: Secondary | ICD-10-CM | POA: Diagnosis not present

## 2017-08-19 ENCOUNTER — Other Ambulatory Visit: Payer: Self-pay

## 2017-08-19 MED ORDER — ROSUVASTATIN CALCIUM 5 MG PO TABS: ORAL_TABLET | ORAL | 5 refills | Status: DC

## 2017-08-19 NOTE — Telephone Encounter (Signed)
Previously prescribed by Dr Candiss Norse Last office visit 06/09/17, increased to 10 mg daily Next office visit 12/18

## 2017-10-20 ENCOUNTER — Other Ambulatory Visit: Payer: Self-pay | Admitting: Family Medicine

## 2017-11-16 ENCOUNTER — Ambulatory Visit (INDEPENDENT_AMBULATORY_CARE_PROVIDER_SITE_OTHER): Payer: PPO

## 2017-11-16 DIAGNOSIS — Z23 Encounter for immunization: Secondary | ICD-10-CM

## 2017-12-14 ENCOUNTER — Ambulatory Visit: Payer: PPO | Admitting: Family Medicine

## 2017-12-21 ENCOUNTER — Other Ambulatory Visit: Payer: Self-pay | Admitting: Family Medicine

## 2017-12-21 ENCOUNTER — Ambulatory Visit: Payer: PPO

## 2017-12-28 ENCOUNTER — Ambulatory Visit (INDEPENDENT_AMBULATORY_CARE_PROVIDER_SITE_OTHER): Payer: PPO | Admitting: Family Medicine

## 2017-12-28 ENCOUNTER — Other Ambulatory Visit: Payer: Self-pay

## 2017-12-28 ENCOUNTER — Encounter: Payer: Self-pay | Admitting: Family Medicine

## 2017-12-28 VITALS — BP 120/70 | HR 54 | Temp 97.3°F | Wt 170.0 lb

## 2017-12-28 DIAGNOSIS — N3941 Urge incontinence: Secondary | ICD-10-CM | POA: Diagnosis not present

## 2017-12-28 DIAGNOSIS — I10 Essential (primary) hypertension: Secondary | ICD-10-CM

## 2017-12-28 DIAGNOSIS — Z1231 Encounter for screening mammogram for malignant neoplasm of breast: Secondary | ICD-10-CM | POA: Diagnosis not present

## 2017-12-28 DIAGNOSIS — E785 Hyperlipidemia, unspecified: Secondary | ICD-10-CM | POA: Diagnosis not present

## 2017-12-28 DIAGNOSIS — R7303 Prediabetes: Secondary | ICD-10-CM

## 2017-12-28 DIAGNOSIS — Z1239 Encounter for other screening for malignant neoplasm of breast: Secondary | ICD-10-CM

## 2017-12-28 DIAGNOSIS — H3530 Unspecified macular degeneration: Secondary | ICD-10-CM

## 2017-12-28 DIAGNOSIS — H9193 Unspecified hearing loss, bilateral: Secondary | ICD-10-CM | POA: Diagnosis not present

## 2017-12-28 LAB — COMPREHENSIVE METABOLIC PANEL
ALT: 14 U/L (ref 0–35)
AST: 19 U/L (ref 0–37)
Albumin: 4.4 g/dL (ref 3.5–5.2)
Alkaline Phosphatase: 59 U/L (ref 39–117)
BILIRUBIN TOTAL: 0.4 mg/dL (ref 0.2–1.2)
BUN: 13 mg/dL (ref 6–23)
CALCIUM: 9.7 mg/dL (ref 8.4–10.5)
CHLORIDE: 103 meq/L (ref 96–112)
CO2: 33 meq/L — AB (ref 19–32)
Creatinine, Ser: 0.92 mg/dL (ref 0.40–1.20)
GFR: 61.28 mL/min (ref >60.00)
GLUCOSE: 97 mg/dL (ref 70–99)
POTASSIUM: 4.2 meq/L (ref 3.5–5.1)
Sodium: 141 mEq/L (ref 135–145)
Total Protein: 6.9 g/dL (ref 6.0–8.3)

## 2017-12-28 LAB — POCT URINALYSIS DIPSTICK
Bilirubin, UA: NEGATIVE
GLUCOSE UA: NEGATIVE
Ketones, UA: NEGATIVE
LEUKOCYTES UA: NEGATIVE
Nitrite, UA: NEGATIVE
PH UA: 7 (ref 5.0–8.0)
Protein, UA: NEGATIVE
RBC UA: NEGATIVE
SPEC GRAV UA: 1.01 (ref 1.010–1.025)
UROBILINOGEN UA: 0.2 U/dL

## 2017-12-28 LAB — LDL CHOLESTEROL, DIRECT: Direct LDL: 117 mg/dL

## 2017-12-28 LAB — HEMOGLOBIN A1C: HEMOGLOBIN A1C: 6.1 % (ref 4.6–6.5)

## 2017-12-28 NOTE — Patient Instructions (Signed)
Nice to meet you. Please try urinating on a schedule every 2 hours to see if that helps with your urgency.  If it does not improve please let us know. We will check lab work today and contact you with the results.

## 2017-12-28 NOTE — Assessment & Plan Note (Signed)
Chronic intermittent issue.  Will check urinalysis.  She wants to avoid medication.  Discussed timed voiding.  She will continue to monitor.

## 2017-12-28 NOTE — Assessment & Plan Note (Signed)
Encouraged dietary changes.  She will stay active.  Check A1c.

## 2017-12-28 NOTE — Progress Notes (Signed)
  Tommi Rumps, MD Phone: [phone]  Jessica Reynolds is a 84 y.o. female who presents today for follow-up.  Hypertension: Not checking.  Only taking metoprolol.  No chest pain, shortness of breath, edema, or lightheadedness.  Hyperlipidemia: Taking Crestor 5 mg 3 times a week.  If she takes it more frequently than that she gets myalgias.  No right upper quadrant pain.  No myalgias on her current dosing.  Has chronic intermittent issues with urge incontinence.  If she does not go to the bathroom soon enough she will leak a tiny bit.  No other symptoms with this.  No dysuria.  This has been going on for a couple of years.  Prediabetes: No polyuria or polydipsia.  She does eat somewhat healthily though does eat all the good food.  She does play tennis 3 times a week.  Not enough vegetables.  Macular degeneration: Followed by ophthalmology.  This has been going on for 5 years.  She notes no distance vision issues.  Does have some close-up vision issues with this.  Social History   Tobacco Use  Smoking Status Never Smoker  Smokeless Tobacco Never Used     ROS see history of present illness  Objective  Physical Exam Vitals:   12/28/17 1124  BP: 120/70  Pulse: (!) 54  Temp: (!) 97.3 F (36.3 C)  SpO2: 95%    BP Readings from Last 3 Encounters:  12/28/17 120/70  06/09/17 124/68  03/09/17 128/74   Wt Readings from Last 3 Encounters:  12/28/17 170 lb (77.1 kg)  06/09/17 168 lb 2 oz (76.3 kg)  03/09/17 169 lb (76.7 kg)    Physical Exam  Constitutional: No distress.  Eyes: Conjunctivae are normal. Pupils are equal, round, and reactive to light.  Cardiovascular: Normal rate, regular rhythm and normal heart sounds.  Pulmonary/Chest: Effort normal and breath sounds normal.  Musculoskeletal: She exhibits no edema.  Neurological: She is alert. Gait normal.  Skin: Skin is warm and dry. She is not diaphoretic.     Assessment/Plan: Please see individual problem  list.  Prediabetes Encouraged dietary changes.  She will stay active.  Check A1c.  Macular degeneration She will continue to follow with ophthalmology.  HLD (hyperlipidemia) Check LDL.  Continue Crestor.  Hearing loss Chronic issues.  She was evaluated by ENT per her report.  She is going elsewhere given her insurance will cover hearing aids through another provider.  Benign essential HTN Controlled.  She reports she is only taking metoprolol.  We will continue to monitor.  Urge incontinence Chronic intermittent issue.  Will check urinalysis.  She wants to avoid medication.  Discussed timed voiding.  She will continue to monitor.   Orders Placed This Encounter  Procedures  . MM SCREENING BREAST TOMO BILATERAL    Standing Status:   Future    Standing Expiration Date:   02/26/2019    Order Specific Question:   Reason for Exam (SYMPTOM  OR DIAGNOSIS REQUIRED)    Answer:   breast cancer screening    Order Specific Question:   Preferred imaging location?    Answer:   [facility]  . Comp Met (CMET)  . Direct LDL  . HgB A1c  . POCT Urinalysis Dipstick    No orders of the defined types were placed in this encounter.    Tommi Rumps, MD [place]

## 2017-12-28 NOTE — Assessment & Plan Note (Signed)
She will continue to follow with ophthalmology 

## 2017-12-28 NOTE — Assessment & Plan Note (Signed)
Controlled.  She reports she is only taking metoprolol.  We will continue to monitor.

## 2017-12-28 NOTE — Assessment & Plan Note (Signed)
Chronic issues.  She was evaluated by ENT per her report.  She is going elsewhere given her insurance will cover hearing aids through another provider.

## 2017-12-28 NOTE — Assessment & Plan Note (Signed)
Check LDL.  Continue Crestor. 

## 2017-12-29 ENCOUNTER — Other Ambulatory Visit: Payer: Self-pay | Admitting: Family Medicine

## 2017-12-29 DIAGNOSIS — E785 Hyperlipidemia, unspecified: Secondary | ICD-10-CM

## 2017-12-29 MED ORDER — ROSUVASTATIN CALCIUM 10 MG PO TABS: ORAL_TABLET | ORAL | 3 refills | Status: DC

## 2018-01-12 ENCOUNTER — Telehealth: Payer: Self-pay | Admitting: Family Medicine

## 2018-01-12 NOTE — Telephone Encounter (Signed)
Copied from [place]. Topic: Quick Communication - See Telephone Encounter >> Jan 12, 2018  3:01 PM Bea Graff, NT wrote: CRM for notification. See Telephone encounter for: Pt calling and states Dr. Caryl Bis changed her dosage on her rosuvastatin (CRESTOR) to take 1 tablet in the morning and 1 at night everyday. Pt needing new rx called into [facility] on [address].  01/12/18.

## 2018-01-12 NOTE — Telephone Encounter (Signed)
Pt is calling for a new prescription on her Crestor. She states she is taking it differently from what was prescribed in her chart. Please advise.  Her last refill was on 01/15.

## 2018-01-12 NOTE — Telephone Encounter (Signed)
Please advise 

## 2018-01-13 MED ORDER — ROSUVASTATIN CALCIUM 10 MG PO TABS: ORAL_TABLET | ORAL | 3 refills | Status: DC

## 2018-01-13 NOTE — Telephone Encounter (Signed)
Left message to return call to ask patient if she is taking two 10 mg tablets 3 times a week, ok for pec to speak to patient

## 2018-01-13 NOTE — Telephone Encounter (Signed)
Sent to pharmacy. Please confirm that she has been taking two 10 mg tablets three times per week. Thanks.

## 2018-01-14 ENCOUNTER — Telehealth: Payer: Self-pay | Admitting: Family Medicine

## 2018-01-14 NOTE — Telephone Encounter (Signed)
Noted.  She should continue as is.  We could consider adding something like Zetia to see if that would be beneficial as well.  Let me know if she is willing to try this.

## 2018-01-14 NOTE — Telephone Encounter (Signed)
Patient would like to try diet and exercise first

## 2018-01-14 NOTE — Telephone Encounter (Signed)
She would like to have the crestor sent in to the pharmacy

## 2018-01-14 NOTE — Telephone Encounter (Signed)
Message from Dr. Caryl Bis read to patient. Verbalizes understanding.

## 2018-01-14 NOTE — Telephone Encounter (Signed)
fyi from [facility]  >> Jan 14, 2018  9:09 AM Cleaster Corin, NT wrote: Talmadge Chad she is only taking one a day. She cant handle taking 2 a day. Pt. Can be reached at [phone]

## 2018-01-14 NOTE — Telephone Encounter (Signed)
Already sent to pharmacy 

## 2018-01-14 NOTE — Telephone Encounter (Signed)
Left message to return call to verify how often she is taking Crestor, is she taking 1 tablet daily everyday or 1 tablet three days a week ok for pec to speak to patient

## 2018-01-14 NOTE — Telephone Encounter (Signed)
Copied from [place] [id number]. Topic: Quick Communication - See Telephone Encounter >> Jan 13, 2018  1:11 PM Juanda Chance, CMA wrote: CRM for notification. See Telephone encounter for:  Left message to return call to ask patient if she is taking two 10 mg tablets 3 times a week, ok for pec to speak to patient 01/13/18. >> Jan 14, 2018  9:09 AM Cleaster Corin, NT wrote: Talmadge Chad she is only taking one a day. She cant handle taking 2 a day. Pt. Can be reached at [phone]

## 2018-01-14 NOTE — Telephone Encounter (Signed)
Copied from [place] [id number]. Topic: Quick Communication - See Telephone Encounter >> Jan 13, 2018  1:11 PM Juanda Chance, CMA wrote: CRM for notification. See Telephone encounter for:  Left message to return call to ask patient if she is taking two 10 mg tablets 3 times a week, ok for pec to speak to patient 01/13/18. >> Jan 14, 2018  9:09 AM Cleaster Corin, NT wrote: Talmadge Chad she is only taking one a day. She cant handle taking 2 a day. Pt. Can be reached at [phone]

## 2018-01-19 ENCOUNTER — Ambulatory Visit
Admission: RE | Admit: 2018-01-19 | Discharge: 2018-01-19 | Disposition: A | Discharge status: Home / Self Care | Payer: PPO | Source: Ambulatory Visit | Attending: Family Medicine | Admitting: Family Medicine

## 2018-01-19 DIAGNOSIS — Z1231 Encounter for screening mammogram for malignant neoplasm of breast: Secondary | ICD-10-CM | POA: Insufficient documentation

## 2018-01-19 DIAGNOSIS — Z1239 Encounter for other screening for malignant neoplasm of breast: Secondary | ICD-10-CM

## 2018-01-21 NOTE — Telephone Encounter (Signed)
Patient takes this three days a week

## 2018-01-25 DIAGNOSIS — L570 Actinic keratosis: Secondary | ICD-10-CM | POA: Diagnosis not present

## 2018-01-25 DIAGNOSIS — L812 Freckles: Secondary | ICD-10-CM | POA: Diagnosis not present

## 2018-01-25 DIAGNOSIS — L578 Other skin changes due to chronic exposure to nonionizing radiation: Secondary | ICD-10-CM | POA: Diagnosis not present

## 2018-01-25 DIAGNOSIS — I8393 Asymptomatic varicose veins of bilateral lower extremities: Secondary | ICD-10-CM | POA: Diagnosis not present

## 2018-01-25 DIAGNOSIS — L821 Other seborrheic keratosis: Secondary | ICD-10-CM | POA: Diagnosis not present

## 2018-01-25 DIAGNOSIS — D225 Melanocytic nevi of trunk: Secondary | ICD-10-CM | POA: Diagnosis not present

## 2018-01-25 DIAGNOSIS — B351 Tinea unguium: Secondary | ICD-10-CM | POA: Diagnosis not present

## 2018-01-25 DIAGNOSIS — D1800 Hemangioma unspecified site: Secondary | ICD-10-CM | POA: Diagnosis not present

## 2018-01-25 DIAGNOSIS — Z1283 Encounter for screening for malignant neoplasm of skin: Secondary | ICD-10-CM | POA: Diagnosis not present

## 2018-02-01 ENCOUNTER — Encounter: Payer: Self-pay | Admitting: *Deleted

## 2018-02-01 ENCOUNTER — Other Ambulatory Visit (INDEPENDENT_AMBULATORY_CARE_PROVIDER_SITE_OTHER): Payer: PPO

## 2018-02-01 DIAGNOSIS — E785 Hyperlipidemia, unspecified: Secondary | ICD-10-CM

## 2018-02-01 LAB — HEPATIC FUNCTION PANEL
ALBUMIN: 4 g/dL (ref 3.5–5.2)
ALT: 13 U/L (ref 0–35)
AST: 17 U/L (ref 0–37)
Alkaline Phosphatase: 52 U/L (ref 39–117)
BILIRUBIN TOTAL: 0.5 mg/dL (ref 0.2–1.2)
Bilirubin, Direct: 0.1 mg/dL (ref 0.0–0.3)
Total Protein: 6.4 g/dL (ref 6.0–8.3)

## 2018-02-01 LAB — LDL CHOLESTEROL, DIRECT: LDL DIRECT: 103 mg/dL

## 2018-03-29 DIAGNOSIS — L570 Actinic keratosis: Secondary | ICD-10-CM | POA: Diagnosis not present

## 2018-03-29 DIAGNOSIS — L578 Other skin changes due to chronic exposure to nonionizing radiation: Secondary | ICD-10-CM | POA: Diagnosis not present

## 2018-03-29 DIAGNOSIS — L858 Other specified epidermal thickening: Secondary | ICD-10-CM | POA: Diagnosis not present

## 2018-04-08 ENCOUNTER — Other Ambulatory Visit: Payer: Self-pay | Admitting: Family Medicine

## 2018-04-12 DIAGNOSIS — H903 Sensorineural hearing loss, bilateral: Secondary | ICD-10-CM | POA: Diagnosis not present

## 2018-05-06 DIAGNOSIS — H903 Sensorineural hearing loss, bilateral: Secondary | ICD-10-CM | POA: Diagnosis not present

## 2018-05-18 DIAGNOSIS — H353131 Nonexudative age-related macular degeneration, bilateral, early dry stage: Secondary | ICD-10-CM | POA: Diagnosis not present

## 2018-07-05 ENCOUNTER — Ambulatory Visit (INDEPENDENT_AMBULATORY_CARE_PROVIDER_SITE_OTHER): Payer: PPO

## 2018-07-05 ENCOUNTER — Encounter: Payer: Self-pay | Admitting: Family Medicine

## 2018-07-05 ENCOUNTER — Ambulatory Visit (INDEPENDENT_AMBULATORY_CARE_PROVIDER_SITE_OTHER): Payer: PPO | Admitting: Family Medicine

## 2018-07-05 VITALS — BP 112/68 | HR 57 | Temp 98.1°F | Resp 14 | Ht 63.75 in | Wt 147.0 lb

## 2018-07-05 VITALS — BP 112/68 | HR 57 | Temp 98.1°F | Ht 63.75 in | Wt 147.0 lb

## 2018-07-05 DIAGNOSIS — E785 Hyperlipidemia, unspecified: Secondary | ICD-10-CM

## 2018-07-05 DIAGNOSIS — I10 Essential (primary) hypertension: Secondary | ICD-10-CM

## 2018-07-05 DIAGNOSIS — R7303 Prediabetes: Secondary | ICD-10-CM

## 2018-07-05 DIAGNOSIS — Z0000 Encounter for general adult medical examination without abnormal findings: Secondary | ICD-10-CM | POA: Diagnosis not present

## 2018-07-05 DIAGNOSIS — I6523 Occlusion and stenosis of bilateral carotid arteries: Secondary | ICD-10-CM | POA: Diagnosis not present

## 2018-07-05 DIAGNOSIS — R600 Localized edema: Secondary | ICD-10-CM | POA: Diagnosis not present

## 2018-07-05 LAB — BASIC METABOLIC PANEL
BUN: 11 mg/dL (ref 6–23)
CHLORIDE: 102 meq/L (ref 96–112)
CO2: 31 mEq/L (ref 19–32)
Calcium: 10.2 mg/dL (ref 8.4–10.5)
Creatinine, Ser: 0.91 mg/dL (ref 0.40–1.20)
GFR: 61.99 mL/min (ref >60.00)
Glucose, Bld: 92 mg/dL (ref 70–99)
Potassium: 3.9 mEq/L (ref 3.5–5.1)
Sodium: 141 mEq/L (ref 135–145)

## 2018-07-05 LAB — HEMOGLOBIN A1C: HEMOGLOBIN A1C: 6 % (ref 4.6–6.5)

## 2018-07-05 LAB — CBC
HCT: 42.2 % (ref 36.0–46.0)
Hemoglobin: 14.1 g/dL (ref 12.0–15.0)
MCHC: 33.5 g/dL (ref 30.0–36.0)
MCV: 93.5 fl (ref 78.0–100.0)
PLATELETS: 170 10*3/uL (ref 150.0–400.0)
RBC: 4.51 Mil/uL (ref 3.87–5.11)
RDW: 14.4 % (ref 11.5–15.5)
WBC: 5.9 10*3/uL (ref 4.0–10.5)

## 2018-07-05 LAB — TSH: TSH: 4.3 u[IU]/mL (ref 0.35–4.50)

## 2018-07-05 NOTE — Patient Instructions (Addendum)
Nice to see you. We will get you set up for an ultrasound of your carotid arteries. We will get lab work and contact you with the results. Please continue diet and exercise.

## 2018-07-05 NOTE — Assessment & Plan Note (Signed)
Suspect venous insufficiency.  We will check lab work to rule out other causes.  Prior liver function normal.  No CHF symptoms.

## 2018-07-05 NOTE — Assessment & Plan Note (Signed)
Noted on screening ultrasound 4 years ago.  Discussed repeating ultrasound.  This will be ordered.

## 2018-07-05 NOTE — Assessment & Plan Note (Addendum)
Well-controlled.  Continue current regimen. 

## 2018-07-05 NOTE — Assessment & Plan Note (Signed)
Continue Crestor 

## 2018-07-05 NOTE — Patient Instructions (Addendum)
  Ms. Cervi , Thank you for taking time to come for your Medicare Wellness Visit. I appreciate your ongoing commitment to your health goals. Please review the following plan we discussed and let me know if I can assist you in the future.   These are the goals we discussed: Goals    . Maintain Healthy Lifestyle     Low carb diet Exercise Stay hydrated       This is a list of the screening recommended for you and due dates:  Health Maintenance  Topic Date Due  . Flu Shot  07/15/2018  . Tetanus Vaccine  01/18/2023  . DEXA scan (bone density measurement)  Completed  . Pneumonia vaccines  Completed

## 2018-07-05 NOTE — Progress Notes (Signed)
  Tommi Rumps, MD Phone: [phone]  Jessica Reynolds is a 84 y.o. female who presents today for f/u.  CC: htn, prediabetes, hld, carotid plaque build up  HYPERTENSION  Disease Monitoring  Home BP Monitoring not checking Chest pain- no    Dyspnea- no Medications  Compliance-  Taking metoprolol, maxzide.  Edema- mild bilateral feet for some time, resolves over night, no orthopnea or PND  HYPERLIPIDEMIA Symptoms Chest pain on exertion:  no    Medications: Compliance- taking crestor Right upper quadrant pain- no  Muscle aches- no  Prediabetes: She notes no polyuria or polydipsia.  She cut out white foods.  She stays active by playing tennis 2 to 3 days a week.  Moves a lot.  She has lost some weight with the diet changes.  Patient brings in a screening form from 4 years ago advising that she had mild carotid plaque buildup.  She wonders about repeat screening for this.    Social History   Tobacco Use  Smoking Status Never Smoker  Smokeless Tobacco Never Used     ROS see history of present illness  Objective  Physical Exam Vitals:   07/05/18 1010  BP: 112/68  Pulse: (!) 57  Temp: 98.1 F (36.7 C)  SpO2: 97%    BP Readings from Last 3 Encounters:  07/05/18 112/68  07/05/18 112/68  12/28/17 120/70   Wt Readings from Last 3 Encounters:  07/05/18 147 lb (66.7 kg)  07/05/18 147 lb (66.7 kg)  12/28/17 170 lb (77.1 kg)    Physical Exam  Constitutional: No distress.  Cardiovascular: Normal rate, regular rhythm and normal heart sounds.  No carotid bruits  Pulmonary/Chest: Effort normal and breath sounds normal.  Musculoskeletal: She exhibits no edema.  Neurological: She is alert.  Skin: Skin is warm and dry. She is not diaphoretic.     Assessment/Plan: Please see individual problem list.  Benign essential HTN Well controlled.  Continue current regimen.  HLD (hyperlipidemia) Continue Crestor.  Prediabetes Check A1c.  Continue diet and  exercise.  Carotid artery plaque, bilateral Noted on screening ultrasound 4 years ago.  Discussed repeating ultrasound.  This will be ordered.  Pedal edema Suspect venous insufficiency.  We will check lab work to rule out other causes.  Prior liver function normal.  No CHF symptoms.   Orders Placed This Encounter  Procedures  . US Carotid Duplex Bilateral    Standing Status:   Future    Standing Expiration Date:   09/06/2019    Order Specific Question:   Reason for exam:    Answer:   history of carotid plaque bilaterallys    Order Specific Question:   Preferred imaging location?    Answer:   [facility]  . Basic Metabolic Panel (BMET)  . HgB A1c  . TSH  . CBC    No orders of the defined types were placed in this encounter.    Tommi Rumps, MD [place]

## 2018-07-05 NOTE — Progress Notes (Signed)
Subjective:   Jessica Reynolds is a 84 y.o. female who presents for Medicare Annual (Subsequent) preventive examination.  Review of Systems:  No ROS.  Medicare Wellness Visit. Additional risk factors are reflected in the social history.  Cardiac Risk Factors include: advanced age (>29men, >43 women);hypertension     Objective:     Vitals: BP 112/68 (BP Location: Left Arm, Patient Position: Sitting, Cuff Size: Normal)   Pulse (!) 57   Temp 98.1 F (36.7 C) (Oral)   Resp 14   Ht 5' 3.75" (1.619 m)   Wt 147 lb (66.7 kg)   SpO2 97%   BMI 25.43 kg/m   Body mass index is 25.43 kg/m.  Advanced Directives 07/05/2018 01/09/2016  Does Patient Have a Medical Advance Directive? Yes No;Yes  Type of Advance Directive Jessica Reynolds will  Does patient want to make changes to medical advance directive? No - Patient declined -  Copy of [place] in Chart? No - copy requested -    Tobacco Social History   Tobacco Use  Smoking Status Never Smoker  Smokeless Tobacco Never Used     Counseling given: Not Answered   Clinical Intake:  Pre-visit preparation completed: Yes  Pain : No/denies pain     Nutritional Status: BMI 25 -29 Overweight Diabetes: No(pre)  How often do you need to have someone help you when you read instructions, pamphlets, or other written materials from your doctor or pharmacy?: 1 - Never  Interpreter Needed?: No     Past Medical History:  Diagnosis Date  . Arthritis   . Colon polyp   . UTI (lower urinary tract infection)    Past Surgical History:  Procedure Laterality Date  . ABDOMINAL HYSTERECTOMY  1980   BSO, fibroids  . ACHILLES TENDON SURGERY Right 1990  . ACHILLES TENDON SURGERY Left 1997  . [place]  . VAGINAL DELIVERY     4   Family History  Problem Relation Age of Onset  . Stroke Mother   . Heart disease Father   . Macular degeneration Sister   . Diabetes Sister   .  Breast cancer Neg Hx    Social History   Socioeconomic History  . Marital status: Widowed    Spouse name: Not on file  . Number of children: Not on file  . Years of education: Not on file  . Highest education level: Not on file  Occupational History  . Not on file  Social Needs  . Financial resource strain: Not hard at all  . Food insecurity:    Worry: Never true    Inability: Never true  . Transportation needs:    Medical: No    Non-medical: No  Tobacco Use  . Smoking status: Never Smoker  . Smokeless tobacco: Never Used  Substance and Sexual Activity  . Alcohol use: Yes    Comment: social  . Drug use: No  . Sexual activity: Not on file  Lifestyle  . Physical activity:    Days per week: Not on file    Minutes per session: Not on file  . Stress: Not at all  Relationships  . Social connections:    Talks on phone: Not on file    Gets together: Not on file    Attends religious service: Not on file    Active member of club or organization: Not on file    Attends meetings of clubs or organizations: Not on  file    Relationship status: Not on file  Other Topics Concern  . Not on file  Social History Narrative   Born in [place], [place].      Lives in [place] with son. Husband passed away 08-Apr-2012. No pets.      Work - retired for [employer] - regular      Exercise - tennis,  3-5 days per week    Outpatient Encounter Medications as of 07/05/2018  Medication Sig  . aspirin EC 81 MG tablet Take 81 mg by mouth daily.  . Calcium-Vitamin D-Vitamin K (VIACTIV PO) Take 1 capsule by mouth daily.  . Cholecalciferol (VITAMIN D3 PO) Take 125 mg by mouth daily.  . metoprolol succinate (TOPROL-XL) 50 MG 24 hr tablet TAKE 1 TABLET BY MOUTH ONCE DAILY (NEEDS  APPOINTMENT  WITH  NEW  PCP  PRIOR  TO  NEXT  REFILL.  CALL  OFFICE)  . Multiple Vitamins-Minerals (PRESERVISION/LUTEIN) CAPS Take 2 capsules by mouth daily.  . rosuvastatin (CRESTOR) 10 MG tablet  TAKE 2 TABLETS BY MOUTH THREE TIMES WEEKLY  . triamterene-hydrochlorothiazide (MAXZIDE-25) 37.5-25 MG tablet TAKE ONE-HALF TABLET BY MOUTH IN THE MORNING   No facility-administered encounter medications on file as of 07/05/2018.     Activities of Daily Living In your present state of health, do you have any difficulty performing the following activities: 07/05/2018  Hearing? Y  Comment Hearing aids  Vision? N  Difficulty concentrating or making decisions? N  Walking or climbing stairs? N  Dressing or bathing? N  Doing errands, shopping? N  Preparing Food and eating ? N  Using the Toilet? N  In the past six months, have you accidently leaked urine? Y  Comment Managed with a daily pad  Do you have problems with loss of bowel control? N  Managing your Medications? N  Managing your Finances? N  Housekeeping or managing your Housekeeping? N  Some recent data might be hidden    Patient Care Team: Leone Haven, MD as PCP - General (Family Medicine) Jackolyn Confer, MD (Internal Medicine)    Assessment:   This is a routine wellness examination for [name].  The goal of the wellness visit is to assist the patient how to close the gaps in care and create a preventative care plan for the patient.   The roster of all physicians providing medical care to patient is listed in the Snapshot section of the chart.  Taking calcium VIT D as appropriate/Osteoporosis risk reviewed.    Safety issues reviewed; Smoke and carbon monoxide detectors in the home. No firearms in the home. Wears seatbelts when driving or riding with others. No violence in the home.  Discussed the need for sun protection: hats, long sleeves and the use of sunscreen if there is significant sun exposure. She plays tennis 3 days, 6 hours a week and understands the importance of sun protection when spending a significant amount of time outside.   Patient is alert, normal appearance, oriented to person/place/and  time. Correctly identified the president of the [place] and recalls of 3/3 words. Performs simple calculations and can read correct time from watch face.Displays appropriate judgement.  No new identified risk were noted.  No failures at ADL's or IADL's.    BMI- discussed the importance of a healthy diet, water intake and the benefits of aerobic exercise. Educational material provided.   24 hour diet recall: Mostly vegetable diet; some lean meats.  Dental- every 6 months.  Health maintenance gaps- closed.  Exercise Activities and Dietary recommendations Current Exercise Habits: Home exercise routine, Time (Minutes): > 60(6 hour, tennis), Frequency (Times/Week): 3, Weekly Exercise (Minutes/Week): 0  Goals    . Maintain Healthy Lifestyle     Low carb diet Exercise Stay hydrated       Fall Risk Fall Risk  07/05/2018 06/09/2017 07/23/2015  Falls in the past year? No No No   Depression Screen PHQ 2/9 Scores 07/05/2018 06/09/2017 07/23/2015  PHQ - 2 Score 0 0 0  PHQ- 9 Score - 0 -     Cognitive Function     6CIT Screen 07/05/2018  What Year? 0 points  What month? 0 points  What time? 0 points  Count back from 20 0 points  Months in reverse 0 points  Repeat phrase 0 points  Total Score 0    Immunization History  Administered Date(s) Administered  . Influenza Split 09/14/2014, 10/11/2014  . Influenza, High Dose Seasonal PF 11/16/2017  . Influenza,inj,Quad PF,6+ Mos 10/24/2015  . Influenza-Unspecified 12/22/2016  . Pneumococcal Conjugate-13 01/28/2016  . Pneumococcal Polysaccharide-23 12/15/1998, 03/12/2011  . Td 01/18/2013  . Zoster 07/05/2007, 01/22/2009    Screening Tests Health Maintenance  Topic Date Due  . INFLUENZA VACCINE  07/15/2018  . TETANUS/TDAP  01/18/2023  . DEXA SCAN  Completed  . PNA vac Low Risk Adult  Completed      Plan:    End of life planning; Advance aging; Advanced directives discussed. Copy of current HCPOA/Living Will requested.    I have  personally reviewed and noted the following in the patient's chart:   . Medical and social history . Use of alcohol, tobacco or illicit drugs  . Current medications and supplements . Functional ability and status . Nutritional status . Physical activity . Advanced directives . List of other physicians . Hospitalizations, surgeries, and ER visits in previous 12 months . Vitals . Screenings to include cognitive, depression, and falls . Referrals and appointments  In addition, I have reviewed and discussed with patient certain preventive protocols, quality metrics, and best practice recommendations. A written personalized care plan for preventive services as well as general preventive health recommendations were provided to patient.     Varney Biles, LPN  08/29/568

## 2018-07-05 NOTE — Assessment & Plan Note (Signed)
Check A1c.  Continue diet and exercise. 

## 2018-07-08 ENCOUNTER — Other Ambulatory Visit: Payer: Self-pay | Admitting: Family Medicine

## 2018-07-20 ENCOUNTER — Ambulatory Visit
Admission: RE | Admit: 2018-07-20 | Discharge: 2018-07-20 | Disposition: A | Discharge status: Home / Self Care | Payer: PPO | Source: Ambulatory Visit | Attending: Family Medicine | Admitting: Family Medicine

## 2018-07-20 DIAGNOSIS — I6523 Occlusion and stenosis of bilateral carotid arteries: Secondary | ICD-10-CM | POA: Diagnosis not present

## 2018-07-20 DIAGNOSIS — I6529 Occlusion and stenosis of unspecified carotid artery: Secondary | ICD-10-CM | POA: Diagnosis not present

## 2018-08-01 ENCOUNTER — Other Ambulatory Visit: Payer: Self-pay | Admitting: Family Medicine

## 2018-10-09 ENCOUNTER — Other Ambulatory Visit: Payer: Self-pay | Admitting: Family Medicine

## 2018-11-05 ENCOUNTER — Ambulatory Visit (INDEPENDENT_AMBULATORY_CARE_PROVIDER_SITE_OTHER): Payer: PPO

## 2018-11-05 DIAGNOSIS — Z23 Encounter for immunization: Secondary | ICD-10-CM

## 2019-01-10 ENCOUNTER — Ambulatory Visit (INDEPENDENT_AMBULATORY_CARE_PROVIDER_SITE_OTHER): Payer: PPO | Admitting: Family Medicine

## 2019-01-10 ENCOUNTER — Encounter: Payer: Self-pay | Admitting: Family Medicine

## 2019-01-10 VITALS — BP 104/70 | HR 53 | Temp 97.4°F | Ht 63.75 in | Wt 133.2 lb

## 2019-01-10 DIAGNOSIS — E785 Hyperlipidemia, unspecified: Secondary | ICD-10-CM

## 2019-01-10 DIAGNOSIS — R002 Palpitations: Secondary | ICD-10-CM

## 2019-01-10 DIAGNOSIS — R7303 Prediabetes: Secondary | ICD-10-CM

## 2019-01-10 DIAGNOSIS — F419 Anxiety disorder, unspecified: Secondary | ICD-10-CM | POA: Insufficient documentation

## 2019-01-10 DIAGNOSIS — I10 Essential (primary) hypertension: Secondary | ICD-10-CM

## 2019-01-10 LAB — COMPREHENSIVE METABOLIC PANEL
ALBUMIN: 4.1 g/dL (ref 3.5–5.2)
ALT: 17 U/L (ref 0–35)
AST: 19 U/L (ref 0–37)
Alkaline Phosphatase: 43 U/L (ref 39–117)
BILIRUBIN TOTAL: 0.5 mg/dL (ref 0.2–1.2)
BUN: 18 mg/dL (ref 6–23)
CALCIUM: 10.1 mg/dL (ref 8.4–10.5)
CO2: 32 meq/L (ref 19–32)
Chloride: 99 mEq/L (ref 96–112)
Creatinine, Ser: 0.87 mg/dL (ref 0.40–1.20)
GFR: 61.35 mL/min (ref >60.00)
GLUCOSE: 79 mg/dL (ref 70–99)
POTASSIUM: 3.7 meq/L (ref 3.5–5.1)
Sodium: 138 mEq/L (ref 135–145)
Total Protein: 6.4 g/dL (ref 6.0–8.3)

## 2019-01-10 LAB — TSH: TSH: 4.65 u[IU]/mL — AB (ref 0.35–4.50)

## 2019-01-10 LAB — CBC
HEMATOCRIT: 41 % (ref 36.0–46.0)
Hemoglobin: 13.6 g/dL (ref 12.0–15.0)
MCHC: 33.1 g/dL (ref 30.0–36.0)
MCV: 93.5 fl (ref 78.0–100.0)
PLATELETS: 184 10*3/uL (ref 150.0–400.0)
RBC: 4.38 Mil/uL (ref 3.87–5.11)
RDW: 14 % (ref 11.5–15.5)
WBC: 6.8 10*3/uL (ref 4.0–10.5)

## 2019-01-10 LAB — LIPID PANEL
CHOL/HDL RATIO: 3
Cholesterol: 165 mg/dL (ref 0–200)
HDL: 61.2 mg/dL (ref >39.00)
LDL Cholesterol: 82 mg/dL (ref 0–99)
NONHDL: 103.86
TRIGLYCERIDES: 107 mg/dL (ref 0.0–149.0)
VLDL: 21.4 mg/dL (ref 0.0–40.0)

## 2019-01-10 LAB — HEMOGLOBIN A1C: Hgb A1c MFr Bld: 5.9 % (ref 4.6–6.5)

## 2019-01-10 NOTE — Progress Notes (Signed)
Tommi Rumps, MD Phone: [phone]  Jessica Reynolds is a 85 y.o. female who presents today for f/u.  CC: hld, htn, palpitations, anxiety  Hyperlipidemia: Taking Crestor.  No right upper quadrant pain or myalgias.  She has been working on diet and exercise.  She has cut carbs weight down.  She has lost weight related to her dietary changes and exercise.  She is not eating any sweets or drinking any sugary drinks.  She is exercising by playing tennis 3 days a week and walking.  She has been able to lose about 30 pounds over the last year.  Hypertension: She is not checking at home.  Taking triamterene-HCTZ, metoprolol.  No chest pain or shortness of breath.  No edema.  No lightheadedness.  Palpitations: Patient reports for quite some time intermittently she has had some palpitations.  She will feel a flutter or like it skips a beat.  Typically occurs when she can't put an end to things in her life.  She feels overwhelmed about trying to get issues settled.  She does note quite a bit of anxiety.  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  She has not done any medication for anxiety previously.  No depression.  She declines medication and therapy.  Social History   Tobacco Use  Smoking Status Never Smoker  Smokeless Tobacco Never Used     ROS see history of present illness  Objective  Physical Exam Vitals:   01/10/19 0923  BP: 104/70  Pulse: (!) 53  Temp: (!) 97.4 F (36.3 C)  SpO2: 99%    BP Readings from Last 3 Encounters:  01/10/19 104/70  07/05/18 112/68  07/05/18 112/68   Wt Readings from Last 3 Encounters:  01/10/19 133 lb 3.2 oz (60.4 kg)  07/05/18 147 lb (66.7 kg)  07/05/18 147 lb (66.7 kg)    Physical Exam Constitutional:      General: She is not in acute distress.    Appearance: She is not diaphoretic.  HENT:     Head: Normocephalic and atraumatic.  Cardiovascular:     Rate and Rhythm: Normal rate and regular rhythm.   Heart sounds: Normal heart sounds.  Pulmonary:     Effort: Pulmonary effort is normal.     Breath sounds: Normal breath sounds.  Musculoskeletal:     Right lower leg: No edema.     Left lower leg: No edema.  Skin:    General: Skin is warm and dry.  Neurological:     Mental Status: She is alert.    EKG: Sinus bradycardia, rate 54, negative precordial T waves, no ischemic changes, no arrhythmia, similar to prior  Assessment/Plan: Please see individual problem list.  Benign essential HTN Well-controlled.  Continue current regimen.  HLD (hyperlipidemia) Continue Crestor.  Check lipid panel.  Palpitations Continues to have issues with this.  Potentially related to anxiety.  EKG completed today.  We will check lab work as outlined below.  Prediabetes Check A1c.  Anxiety Discussed medication or therapy is options for treatment though she has declined these.  She wants to monitor while trying to deal with the current life issues.  If worsening she will contact us.   Orders Placed This Encounter  Procedures  . Comp Met (CMET)  . Lipid panel  . HgB A1c  . CBC  . TSH  . EKG 12-Lead    No orders of the defined types were placed in this encounter.    Tommi Rumps,  MD [place]

## 2019-01-10 NOTE — Assessment & Plan Note (Signed)
Well-controlled.  Continue current regimen. 

## 2019-01-10 NOTE — Assessment & Plan Note (Signed)
Continue Crestor. Check lipid panel.  

## 2019-01-10 NOTE — Assessment & Plan Note (Signed)
Check A1c. 

## 2019-01-10 NOTE — Patient Instructions (Addendum)
Nice to see you. We will get lab work today and contact you with the results. Please continue to work on diet and exercise. If your anxiety worsens please contact us.  Please let us know if you would like to consider therapy or medication for your anxiety.

## 2019-01-10 NOTE — Assessment & Plan Note (Signed)
Continues to have issues with this.  Potentially related to anxiety.  EKG completed today.  We will check lab work as outlined below.

## 2019-01-10 NOTE — Assessment & Plan Note (Signed)
Discussed medication or therapy is options for treatment though she has declined these.  She wants to monitor while trying to deal with the current life issues.  If worsening she will contact us.

## 2019-01-13 ENCOUNTER — Other Ambulatory Visit: Payer: Self-pay | Admitting: Family Medicine

## 2019-01-13 DIAGNOSIS — R7989 Other specified abnormal findings of blood chemistry: Secondary | ICD-10-CM

## 2019-01-17 DIAGNOSIS — D692 Other nonthrombocytopenic purpura: Secondary | ICD-10-CM | POA: Diagnosis not present

## 2019-01-17 DIAGNOSIS — L812 Freckles: Secondary | ICD-10-CM | POA: Diagnosis not present

## 2019-01-17 DIAGNOSIS — L821 Other seborrheic keratosis: Secondary | ICD-10-CM | POA: Diagnosis not present

## 2019-01-17 DIAGNOSIS — Z1283 Encounter for screening for malignant neoplasm of skin: Secondary | ICD-10-CM | POA: Diagnosis not present

## 2019-01-17 DIAGNOSIS — L570 Actinic keratosis: Secondary | ICD-10-CM | POA: Diagnosis not present

## 2019-01-17 DIAGNOSIS — Z85828 Personal history of other malignant neoplasm of skin: Secondary | ICD-10-CM | POA: Diagnosis not present

## 2019-01-17 DIAGNOSIS — L578 Other skin changes due to chronic exposure to nonionizing radiation: Secondary | ICD-10-CM | POA: Diagnosis not present

## 2019-01-17 DIAGNOSIS — D1800 Hemangioma unspecified site: Secondary | ICD-10-CM | POA: Diagnosis not present

## 2019-01-28 ENCOUNTER — Ambulatory Visit (INDEPENDENT_AMBULATORY_CARE_PROVIDER_SITE_OTHER): Payer: PPO | Admitting: Family Medicine

## 2019-01-28 VITALS — BP 120/66 | HR 56 | Temp 97.8°F | Resp 16 | Ht 63.5 in | Wt 135.6 lb

## 2019-01-28 DIAGNOSIS — N3001 Acute cystitis with hematuria: Secondary | ICD-10-CM | POA: Diagnosis not present

## 2019-01-28 DIAGNOSIS — R350 Frequency of micturition: Secondary | ICD-10-CM

## 2019-01-28 LAB — POCT URINALYSIS DIPSTICK
Bilirubin, UA: NEGATIVE
Blood, UA: 1
GLUCOSE UA: NEGATIVE
KETONES UA: NEGATIVE
Nitrite, UA: NEGATIVE
PROTEIN UA: NEGATIVE
Spec Grav, UA: 1.01 (ref 1.010–1.025)
Urobilinogen, UA: 0.2 E.U./dL
pH, UA: 7 (ref 5.0–8.0)

## 2019-01-28 MED ORDER — CEFDINIR 300 MG PO CAPS: 300.0000 mg | ORAL_CAPSULE | Freq: Two times a day (BID) | ORAL | 0 refills | Status: AC

## 2019-01-28 NOTE — Progress Notes (Signed)
   Subjective:    Patient ID: Jessica Reynolds, female    DOB: 11-04-1928, 85 y.o.   MRN: [id number]  HPI   Patient presents to clinic complaining of burning and increased urinary frequency for the past 3 days.  Patient tries to be very careful and always wiping front to back, and also using a bidet after urination/bowel movements.  States she does not get UTIs often, last UTI was 3 years ago.  Denies fever chills.  Denies nausea/vomiting or diarrhea.  Patient Active Problem List   Diagnosis Date Noted  . Anxiety 01/10/2019  . Carotid artery plaque, bilateral 07/05/2018  . Pedal edema 07/05/2018  . Urge incontinence 12/28/2017  . Macular degeneration 06/09/2017  . Hearing loss 06/09/2017  . Prediabetes 03/09/2017  . Palpitations 03/09/2017  . Benign essential HTN 08/14/2014  . HLD (hyperlipidemia) 08/14/2014   Social History   Tobacco Use  . Smoking status: Never Smoker  . Smokeless tobacco: Never Used  Substance Use Topics  . Alcohol use: Yes    Comment: social   Review of Systems  Constitutional: Negative for chills, fatigue and fever.  HENT: Negative for congestion, ear pain, sinus pain and sore throat.   Eyes: Negative.   Respiratory: Negative for cough, shortness of breath and wheezing.   Cardiovascular: Negative for chest pain, palpitations and leg swelling.  Gastrointestinal: Negative for abdominal pain, diarrhea, nausea and vomiting.  Genitourinary: +dysuria, frequency and urgency.  Musculoskeletal: Negative for arthralgias and myalgias.  Skin: Negative for color change, pallor and rash.  Neurological: Negative for syncope, light-headedness and headaches.  Psychiatric/Behavioral: The patient is not nervous/anxious.       Objective:   Physical Exam Vitals signs and nursing note reviewed.  Constitutional:      General: She is not in acute distress.    Appearance: She is not toxic-appearing.  HENT:     Head: Normocephalic and atraumatic.  Cardiovascular:    Rate and Rhythm: Normal rate and regular rhythm.  Pulmonary:     Effort: Pulmonary effort is normal.     Breath sounds: Normal breath sounds.  Abdominal:     General: Abdomen is flat. Bowel sounds are normal. There is no distension.     Palpations: Abdomen is soft.     Tenderness: There is no abdominal tenderness.  Skin:    General: Skin is warm and dry.     Coloration: Skin is not jaundiced or pale.  Neurological:     Mental Status: She is alert and oriented to person, place, and time.  Psychiatric:        Mood and Affect: Mood normal.        Behavior: Behavior normal.     Vitals:   01/28/19 0915  BP: 120/66  Pulse: (!) 56  Resp: 16  Temp: 97.8 F (36.6 C)  SpO2: 98%      Assessment & Plan:   Urinary tract infection -dipstick positive for leukocytes and trace blood.  In combination with dipstick results and symptoms we will treat for UTI.  She will take Omnicef twice daily for 5 days.  Advised to increase water intake, avoid excess sugary and caffeinated beverages.  She will repeat urine sample in approximately 4 weeks when she returns to clinic for other labs to be sure blood has cleared.  Otherwise she will keep regularly scheduled follow-up PCP as planned.  Advised she can return to clinic sooner if any issues arise.

## 2019-01-28 NOTE — Patient Instructions (Signed)
Urinary Tract Infection, Adult A urinary tract infection (UTI) is an infection of any part of the urinary tract. The urinary tract includes:  The kidneys.  The ureters.  The bladder.  The urethra. These organs make, store, and get rid of pee (urine) in the body. What are the causes? This is caused by germs (bacteria) in your genital area. These germs grow and cause swelling (inflammation) of your urinary tract. What increases the risk? You are more likely to develop this condition if:  You have a small, thin tube (catheter) to drain pee.  You cannot control when you pee or poop (incontinence).  You are female, and: ? You use these methods to prevent pregnancy: ? A medicine that kills sperm (spermicide). ? A device that blocks sperm (diaphragm). ? You have low levels of a female hormone (estrogen). ? You are pregnant.  You have genes that add to your risk.  You are sexually active.  You take antibiotic medicines.  You have trouble peeing because of: ? A prostate that is bigger than normal, if you are female. ? A blockage in the part of your body that drains pee from the bladder (urethra). ? A kidney stone. ? A nerve condition that affects your bladder (neurogenic bladder). ? Not getting enough to drink. ? Not peeing often enough.  You have other conditions, such as: ? Diabetes. ? A weak disease-fighting system (immune system). ? Sickle cell disease. ? Gout. ? Injury of the spine. What are the signs or symptoms? Symptoms of this condition include:  Needing to pee right away (urgently).  Peeing often.  Peeing small amounts often.  Pain or burning when peeing.  Blood in the pee.  Pee that smells bad or not like normal.  Trouble peeing.  Pee that is cloudy.  Fluid coming from the vagina, if you are female.  Pain in the belly or lower back. Other symptoms include:  Throwing up (vomiting).  No urge to eat.  Feeling mixed up (confused).  Being tired  and grouchy (irritable).  A fever.  Watery poop (diarrhea). How is this treated? This condition may be treated with:  Antibiotic medicine.  Other medicines.  Drinking enough water. Follow these instructions at home:  Medicines  Take over-the-counter and prescription medicines only as told by your doctor.  If you were prescribed an antibiotic medicine, take it as told by your doctor. Do not stop taking it even if you start to feel better. General instructions  Make sure you: ? Pee until your bladder is empty. ? Do not hold pee for a long time. ? Empty your bladder after sex. ? Wipe from front to back after pooping if you are a female. Use each tissue one time when you wipe.  Drink enough fluid to keep your pee pale yellow.  Keep all follow-up visits as told by your doctor. This is important. Contact a doctor if:  You do not get better after 1-2 days.  Your symptoms go away and then come back. Get help right away if:  You have very bad back pain.  You have very bad pain in your lower belly.  You have a fever.  You are sick to your stomach (nauseous).  You are throwing up. Summary  A urinary tract infection (UTI) is an infection of any part of the urinary tract.  This condition is caused by germs in your genital area.  There are many risk factors for a UTI. These include having a small, thin   tube to drain pee and not being able to control when you pee or poop.  Treatment includes antibiotic medicines for germs.  Drink enough fluid to keep your pee pale yellow. This information is not intended to replace advice given to you by your health care provider. Make sure you discuss any questions you have with your health care provider. Document Released: 05/19/2008 Document Revised: 06/10/2018 Document Reviewed: 06/10/2018 Elsevier Interactive Patient Education  2019 [employer] Inc[employer]  

## 2019-01-29 ENCOUNTER — Other Ambulatory Visit: Payer: Self-pay | Admitting: Family Medicine

## 2019-01-29 DIAGNOSIS — R002 Palpitations: Secondary | ICD-10-CM

## 2019-01-29 DIAGNOSIS — E785 Hyperlipidemia, unspecified: Secondary | ICD-10-CM

## 2019-01-30 LAB — URINE CULTURE
MICRO NUMBER:: 197257
SPECIMEN QUALITY:: ADEQUATE

## 2019-02-05 ENCOUNTER — Encounter: Payer: Self-pay | Admitting: Family Medicine

## 2019-02-07 ENCOUNTER — Ambulatory Visit: Payer: Self-pay | Admitting: Family Medicine

## 2019-02-07 NOTE — Telephone Encounter (Signed)
Called Pt and scheduled her an appt 02/08/2019 @3 :00pm with NP Philis Nettle for UTI Follow Up

## 2019-02-07 NOTE — Telephone Encounter (Signed)
Pt called [facility] I returned her call.    She saw Philis Nettle, FNP-C on 01/28/2019 for UTI.   She took the antibiotic which she completed last Tuesday.   She is still having the same symptoms.  See triage notes.  She has requested that I check with Dr. Caryl Bis to see if he will call in another antibiotic before coming in again unless he feels she needs to be seen again.  I forwarded these triage notes to Dr. Ellen Henri nurse pool and Philis Nettle, FNP-C since she is the one that saw her last.   I let the pt know someone would be in contact with her from the office.  She was agreeable to the plan.

## 2019-02-07 NOTE — Telephone Encounter (Signed)
I returned her call.    She saw Philis Nettle, FNP-C on 01/28/2019 for UTI.   She took the antibiotic which she completed last Tuesday.   She is still having the same symptoms.  See triage notes.  She has requested that I check with Dr. Caryl Bis to see if he will call in another antibiotic before coming in again unless he feels she needs to be seen again.  I forwarded these triage notes to Dr. Ellen Henri nurse pool and Philis Nettle, FNP-C since she is the one that saw her last.   I let the pt know someone would be in contact with her from the office.  She was agreeable to the plan.     Reason for Disposition . [1] Taking antibiotic > 72 hours (3 days) for UTI AND [2] painful urination or frequency not improved  Answer Assessment - Initial Assessment Questions 1. SYMPTOM: "What's the main symptom you're concerned about?" (e.g., frequency, incontinence)     Burning and frequency.   Completed antibiotic last Tuesday. 2. ONSET: "When did the  symptoms  start?"     A week before seen on 01/28/2019 3. PAIN: "Is there any pain?" If so, ask: "How bad is it?" (Scale: 1-10; mild, moderate, severe)     Burning.    No pain in abd or lower back. 4. CAUSE: "What do you think is causing the symptoms?"     UTI not improved after antibiotic.     I've had a kidney infection in the past and ended up in the hospital. 5. OTHER SYMPTOMS: "Do you have any other symptoms?" (e.g., fever, flank pain, blood in urine, pain with urination)     No blood. 6. PREGNANCY: "Is there any chance you are pregnant?" "When was your last menstrual period?"     Not asked due age.  Protocols used: URINARY TRACT INFECTION ON ANTIBIOTIC FOLLOW-UP CALL - FEMALE-A-AH, URINARY [facility]

## 2019-02-07 NOTE — Telephone Encounter (Signed)
She needs to be seen again and rechecked.

## 2019-02-08 ENCOUNTER — Ambulatory Visit (INDEPENDENT_AMBULATORY_CARE_PROVIDER_SITE_OTHER): Payer: PPO | Admitting: Family Medicine

## 2019-02-08 ENCOUNTER — Encounter: Payer: Self-pay | Admitting: Family Medicine

## 2019-02-08 VITALS — BP 98/58 | HR 58 | Temp 97.4°F | Resp 16 | Ht 63.5 in | Wt 134.2 lb

## 2019-02-08 DIAGNOSIS — R6883 Chills (without fever): Secondary | ICD-10-CM

## 2019-02-08 DIAGNOSIS — N390 Urinary tract infection, site not specified: Secondary | ICD-10-CM | POA: Diagnosis not present

## 2019-02-08 DIAGNOSIS — R350 Frequency of micturition: Secondary | ICD-10-CM

## 2019-02-08 LAB — POCT URINALYSIS DIPSTICK
Bilirubin, UA: NEGATIVE
Glucose, UA: NEGATIVE
Ketones, UA: NEGATIVE
Nitrite, UA: NEGATIVE
PH UA: 6 (ref 5.0–8.0)
PROTEIN UA: NEGATIVE
RBC UA: NEGATIVE
Spec Grav, UA: 1.015 (ref 1.010–1.025)
UROBILINOGEN UA: 0.2 U/dL

## 2019-02-08 MED ORDER — CEFTRIAXONE SODIUM 1 G IJ SOLR
1.0000 g | Freq: Once | INTRAMUSCULAR | Status: AC
  Administered 2019-02-08: 1 g via INTRAMUSCULAR

## 2019-02-08 NOTE — Progress Notes (Signed)
Subjective:    Patient ID: Jessica Reynolds, female    DOB: October 26, 1928, 85 y.o.   MRN: [id number]  HPI   Patient presents to clinic due to resurgence of UTI symptoms.  Patient states she did take the antibiotic as prescribed at last visit, and burning, frequency did improve for a few days, but over this past weekend she began to have burning again, increased urinary frequency and did have one episode of chills on Saturday, 02/05/2019.  Denies any nausea or vomiting.  Denies diarrhea.  Stools have been normal.  Appetite has been normal.  Denies seeing any blood or pink tinge in urine.  Patient Active Problem List   Diagnosis Date Noted  . Anxiety 01/10/2019  . Carotid artery plaque, bilateral 07/05/2018  . Pedal edema 07/05/2018  . Urge incontinence 12/28/2017  . Macular degeneration 06/09/2017  . Hearing loss 06/09/2017  . Prediabetes 03/09/2017  . Palpitations 03/09/2017  . Benign essential HTN 08/14/2014  . HLD (hyperlipidemia) 08/14/2014   Social History   Tobacco Use  . Smoking status: Never Smoker  . Smokeless tobacco: Never Used  Substance Use Topics  . Alcohol use: Yes    Comment: social   Review of Systems  Constitutional: Negative for fatigue and fever. +chills x1 day HENT: Negative for congestion, ear pain, sinus pain and sore throat.   Eyes: Negative.   Respiratory: Negative for cough, shortness of breath and wheezing.   Cardiovascular: Negative for chest pain, palpitations and leg swelling.  Gastrointestinal: Negative for abdominal pain, diarrhea, nausea and vomiting.  Genitourinary: Negative for dysuria, frequency and urgency.  Musculoskeletal: Negative for arthralgias and myalgias.  Skin: Negative for color change, pallor and rash.  Neurological: Negative for syncope, light-headedness and headaches.  Psychiatric/Behavioral: The patient is not nervous/anxious.       Objective:   Physical Exam Vitals signs and nursing note reviewed.  Constitutional:    General: She is not in acute distress.    Appearance: She is well-developed. She is not toxic-appearing.  HENT:     Head: Normocephalic and atraumatic.  Eyes:     General: No scleral icterus.    Extraocular Movements: Extraocular movements intact.     Conjunctiva/sclera: Conjunctivae normal.  Neck:     Musculoskeletal: Neck supple.     Trachea: No tracheal deviation.  Cardiovascular:     Rate and Rhythm: Normal rate and regular rhythm.     Heart sounds: Normal heart sounds.  Pulmonary:     Effort: Pulmonary effort is normal. No respiratory distress.     Breath sounds: Normal breath sounds.  Abdominal:     General: Bowel sounds are normal. There is no distension.     Palpations: Abdomen is soft. There is no mass.     Tenderness: There is abdominal tenderness. There is no right CVA tenderness, left CVA tenderness, guarding or rebound.  Skin:    General: Skin is warm and dry.     Coloration: Skin is not jaundiced or pale.  Neurological:     Mental Status: She is alert and oriented to person, place, and time.     Gait: Gait normal.  Psychiatric:        Mood and Affect: Mood normal.        Behavior: Behavior normal.    Vitals:   02/08/19 1513  BP: (!) 98/58  Pulse: (!) 58  Resp: 16  Temp: (!) 97.4 F (36.3 C)  SpO2: 95%       Assessment &  Plan:   UTI without hematuria, chills, urine frequency - patient was treated with course of Fouke which was shown to be sensitive on previous urine culture.  Urinalysis dipstick today actually appears improved from dipstick at last visit.  Today she only has leukocytes, last visit she has leukocytes and blood.  Due to patient's symptoms returning we will give her a one-time shot of IM Rocephin in clinic today.  Urine culture sent to lab.  We will see what urine culture results come back as and also follow-up with patient's symptoms when she is contacted for urine culture results.  We will also get blood work in clinic today including CBC  and CMP to look for any anemia, elevated white cells, electrolyte, kidney abnormalities.  Patient encouraged to keep up with water intake, avoid excess caffeine and sugar, wear cotton underwear and always wiping front to back after using the restroom.  She will keep regularly scheduled follow-up with PCP as planned.  Advised to return to clinic sooner if any issues arise.

## 2019-02-08 NOTE — Telephone Encounter (Signed)
Patient scheduled to be seen by Philis Nettle today.

## 2019-02-09 LAB — BASIC METABOLIC PANEL
BUN: 20 mg/dL (ref 6–23)
CALCIUM: 9.6 mg/dL (ref 8.4–10.5)
CHLORIDE: 103 meq/L (ref 96–112)
CO2: 31 meq/L (ref 19–32)
Creatinine, Ser: 0.88 mg/dL (ref 0.40–1.20)
GFR: 60.54 mL/min (ref >60.00)
GLUCOSE: 121 mg/dL — AB (ref 70–99)
Potassium: 3.9 mEq/L (ref 3.5–5.1)
SODIUM: 140 meq/L (ref 135–145)

## 2019-02-09 LAB — CBC WITH DIFFERENTIAL/PLATELET
BASOS ABS: 0.1 10*3/uL (ref 0.0–0.1)
BASOS PCT: 1.1 % (ref 0.0–3.0)
EOS PCT: 2.3 % (ref 0.0–5.0)
Eosinophils Absolute: 0.2 10*3/uL (ref 0.0–0.7)
HCT: 39.6 % (ref 36.0–46.0)
Hemoglobin: 13.5 g/dL (ref 12.0–15.0)
LYMPHS ABS: 1.4 10*3/uL (ref 0.7–4.0)
Lymphocytes Relative: 17 % (ref 12.0–46.0)
MCHC: 34.1 g/dL (ref 30.0–36.0)
MCV: 92.9 fl (ref 78.0–100.0)
MONO ABS: 0.6 10*3/uL (ref 0.1–1.0)
MONOS PCT: 7.1 % (ref 3.0–12.0)
NEUTROS ABS: 5.9 10*3/uL (ref 1.4–7.7)
NEUTROS PCT: 72.5 % (ref 43.0–77.0)
Platelets: 183 10*3/uL (ref 150.0–400.0)
RBC: 4.26 Mil/uL (ref 3.87–5.11)
RDW: 14 % (ref 11.5–15.5)
WBC: 8.2 10*3/uL (ref 4.0–10.5)

## 2019-02-09 LAB — HEPATIC FUNCTION PANEL
ALK PHOS: 48 U/L (ref 39–117)
ALT: 18 U/L (ref 0–35)
AST: 19 U/L (ref 0–37)
Albumin: 4 g/dL (ref 3.5–5.2)
BILIRUBIN TOTAL: 0.3 mg/dL (ref 0.2–1.2)
Bilirubin, Direct: 0.1 mg/dL (ref 0.0–0.3)
Total Protein: 5.9 g/dL — ABNORMAL LOW (ref 6.0–8.3)

## 2019-02-10 LAB — URINE CULTURE
MICRO NUMBER: 239561
SPECIMEN QUALITY: ADEQUATE

## 2019-02-28 ENCOUNTER — Other Ambulatory Visit (INDEPENDENT_AMBULATORY_CARE_PROVIDER_SITE_OTHER): Payer: PPO

## 2019-02-28 ENCOUNTER — Other Ambulatory Visit: Payer: Self-pay

## 2019-02-28 DIAGNOSIS — R7989 Other specified abnormal findings of blood chemistry: Secondary | ICD-10-CM

## 2019-02-28 DIAGNOSIS — E785 Hyperlipidemia, unspecified: Secondary | ICD-10-CM

## 2019-02-28 LAB — LDL CHOLESTEROL, DIRECT: Direct LDL: 71 mg/dL

## 2019-02-28 LAB — HEPATIC FUNCTION PANEL
ALT: 16 U/L (ref 0–35)
AST: 18 U/L (ref 0–37)
Albumin: 4 g/dL (ref 3.5–5.2)
Alkaline Phosphatase: 44 U/L (ref 39–117)
BILIRUBIN DIRECT: 0.1 mg/dL (ref 0.0–0.3)
TOTAL PROTEIN: 6.3 g/dL (ref 6.0–8.3)
Total Bilirubin: 0.5 mg/dL (ref 0.2–1.2)

## 2019-02-28 LAB — TSH: TSH: 2.87 u[IU]/mL (ref 0.35–4.50)

## 2019-03-01 LAB — T4, FREE: Free T4: 0.85 ng/dL (ref 0.60–1.60)

## 2019-04-12 ENCOUNTER — Other Ambulatory Visit: Payer: Self-pay | Admitting: Family Medicine

## 2019-04-22 ENCOUNTER — Ambulatory Visit: Payer: Self-pay | Admitting: *Deleted

## 2019-04-22 NOTE — Telephone Encounter (Signed)
Message sento provider as a FYI, pt will call back to schedule visit if symptoms get worse.  Nina,cma

## 2019-04-22 NOTE — Telephone Encounter (Signed)
Pt left message on COVID voicemail 04/22/2019 at [place] concerning a sore throat that she developed on 04/20/2019; the pt says that she worked in her yard that day, and trimmed a heavily pollinated azalea; she has taken chloraseptic and cody's lozenges; she has not used her flonase as previously ordered; the pt also says that her throat is not as painful today, but it is still sore; the pt says that she is not concerned about COVID virus exposure; she answers "no" to all questions on [facility] Corona Virus Eval; recommendations made per nurse triage  protocol; the pt also advised to use her previously ordered flonase; she verbalizes understanding, and would like to try these before scheduling an appointment with Dr Caryl Bis; [facility]; will route to office for notification of this encounter.  Reason for Disposition . [1] Sore throat with cough/cold symptoms AND [2] present < 5 days  Answer Assessment - Initial Assessment Questions 1. ONSET: "When did the throat start hurting?" (Hours or days ago)      04/20/2019 2. SEVERITY: "How bad is the sore throat?" (Scale 1-10; mild, moderate or severe)   - MILD (1-3):  doesn't interfere with eating or normal activities   - MODERATE (4-7): interferes with eating some solids and normal activities   - SEVERE (8-10):  excruciating pain, interferes with most normal activities   - SEVERE DYSPHAGIA: can't swallow liquids, drooling 3 out of 10 3. STREP EXPOSURE: "Has there been any exposure to strep within the past week?" If so, ask: "What type of contact occurred?"     no 4.  VIRAL SYMPTOMS: "Are there any symptoms of a cold, such as a runny nose, cough, hoarse voice or red eyes?"     Post nasal drip 5. FEVER: "Do you have a fever?" If so, ask: "What is your temperature, how was it measured, and when did it start?"    no 6. PUS ON THE TONSILS: "Is there pus on the tonsils in the back of your throat?"   no 7. OTHER SYMPTOMS: "Do you have any other symptoms?" (e.g.,  difficulty breathing, headache, rash)    no 8. PREGNANCY: "Is there any chance you are pregnant?" "When was your last menstrual period?"  no  Protocols used: SORE THROAT-A-AH

## 2019-04-23 NOTE — Telephone Encounter (Signed)
It would seem that her symptoms are likely allergic rhinitis related.  Please follow-up with her this week to see if she has been improving.  Thanks.

## 2019-04-25 NOTE — Telephone Encounter (Signed)
Called and lm for the pt to call back I called to see how the pt was feeling after she called a few days ago with cold symptoms.  Nina,cma

## 2019-04-25 NOTE — Telephone Encounter (Signed)
Called pt to check on her after her call last week of a sore throat and she states she received a call from the Covid hotline at [place] and she informed them of her sore throat and they determined it was a pollen exposure she stated she worked in the yard all week, and now her sore throat is gone and she feels fine, just a fyi.  Shaneisha Burkel,cma

## 2019-04-29 ENCOUNTER — Telehealth: Payer: Self-pay

## 2019-04-29 NOTE — Telephone Encounter (Signed)
Patient cancelled her visit with Dr. Fletcher Anon on May 03, 2019 to June 28, 2019. The patient didn't want to do a Virtual Video visit but did r/s. The patient will contact our office if she feels should be seen sooner than July.

## 2019-05-03 ENCOUNTER — Ambulatory Visit: Payer: PPO | Admitting: Cardiovascular Disease

## 2019-05-17 DIAGNOSIS — H353131 Nonexudative age-related macular degeneration, bilateral, early dry stage: Secondary | ICD-10-CM | POA: Diagnosis not present

## 2019-05-31 ENCOUNTER — Other Ambulatory Visit: Payer: Self-pay

## 2019-05-31 ENCOUNTER — Encounter: Payer: Self-pay | Admitting: Family Medicine

## 2019-05-31 ENCOUNTER — Ambulatory Visit (INDEPENDENT_AMBULATORY_CARE_PROVIDER_SITE_OTHER): Payer: PPO | Admitting: Family Medicine

## 2019-05-31 DIAGNOSIS — N390 Urinary tract infection, site not specified: Secondary | ICD-10-CM | POA: Diagnosis not present

## 2019-05-31 DIAGNOSIS — R300 Dysuria: Secondary | ICD-10-CM

## 2019-05-31 MED ORDER — CEFDINIR 300 MG PO CAPS: 300.0000 mg | ORAL_CAPSULE | Freq: Two times a day (BID) | ORAL | 0 refills | Status: AC

## 2019-05-31 NOTE — Progress Notes (Signed)
Patient ID: Jessica Reynolds, female   DOB: [birth year], 85 y.o.   MRN: [id number]   Virtual Visit via phone Note  This visit type was conducted due to national recommendations for restrictions regarding the COVID-19 pandemic (e.g. social distancing).  This format is felt to be most appropriate for this patient at this time.  All issues noted in this document were discussed and addressed.  No physical exam was performed (except for noted visual exam findings with Video Visits).   I connected with Jessica Reynolds today at  1:40 PM EDT by telephone and verified that I am speaking with the correct person using two identifiers. Location patient: home Location provider: [place] Persons participating in the virtual visit: patient, provider  I discussed the limitations, risks, security and privacy concerns of performing an evaluation and management service by telephone and the availability of in person appointments. I also discussed with the patient that there may be a patient responsible charge related to this service. The patient expressed understanding and agreed to proceed.   HPI:  Patient and I connected via telephone due to possible UTI.  Patient states she is having burning with urination, increased urinary frequency and urgency developing over the past 3-4 days.  Patient has a beach trip coming up next week with her family and wants to be sure that UTI is treated.  ROS:   Constitutional: Negative for chills, fatigue and fever.  HENT: Negative for congestion, ear pain, sinus pain and sore throat.   Eyes: Negative.   Respiratory: Negative for cough, shortness of breath and wheezing.   Cardiovascular: Negative for chest pain, palpitations and leg swelling.  Gastrointestinal: Negative for abdominal pain, diarrhea, nausea and vomiting.  Genitourinary: +dysuria, frequency and urgency.  Musculoskeletal: Negative for arthralgias and myalgias.  Skin: Negative for color change, pallor and  rash.  Neurological: Negative for syncope, light-headedness and headaches.  Psychiatric/Behavioral: The patient is not nervous/anxious.     Past Medical History:  Diagnosis Date  . Arthritis   . Colon polyp   . UTI (lower urinary tract infection)     Past Surgical History:  Procedure Laterality Date  . ABDOMINAL HYSTERECTOMY  1980   BSO, fibroids  . ACHILLES TENDON SURGERY Right 1990  . ACHILLES TENDON SURGERY Left 1997  . [place]  . VAGINAL DELIVERY     4    Family History  Problem Relation Age of Onset  . Stroke Mother   . Heart disease Father   . Macular degeneration Sister   . Diabetes Sister   . Breast cancer Neg Hx    Social History   Tobacco Use  . Smoking status: Never Smoker  . Smokeless tobacco: Never Used  Substance Use Topics  . Alcohol use: Yes    Comment: social     Current Outpatient Medications:  .  aspirin EC 81 MG tablet, Take 81 mg by mouth daily., Disp: , Rfl:  .  Calcium-Vitamin D-Vitamin K (VIACTIV PO), Take 1 capsule by mouth daily., Disp: , Rfl:  .  Cholecalciferol (VITAMIN D3 PO), Take 125 mg by mouth daily., Disp: , Rfl:  .  fluticasone (FLONASE) 50 MCG/ACT nasal spray, USE TWO SPRAY(S) IN EACH NOSTRIL ONCE DAILY, Disp: 16 g, Rfl: 6 .  metoprolol succinate (TOPROL-XL) 50 MG 24 hr tablet, Take 1 tablet by mouth once daily, Disp: 90 tablet, Rfl: 0 .  Multiple Vitamins-Minerals (PRESERVISION/LUTEIN) CAPS, Take 2 capsules by mouth daily., Disp: , Rfl:  .  rosuvastatin (CRESTOR) 10 MG tablet, TAKE TWO TABLETS BY MOUTH THREE TIMES WEEKLY, Disp: 48 tablet, Rfl: 0 .  triamterene-hydrochlorothiazide (MAXZIDE-25) 37.5-25 MG tablet, TAKE 1/2 (ONE-HALF) TABLET BY MOUTH IN THE MORNING, Disp: 45 tablet, Rfl: 0  EXAM:  GENERAL: alert, oriented, sounds well and in no acute distress  LUNGS: Speaking in full sentences. no signs of respiratory distress, breathing rate appears normal, no obvious gross SOB, gasping or wheezing   PSYCH/NEURO: pleasant and cooperative, no obvious depression or anxiety, speech and thought processing grossly intact  ASSESSMENT AND PLAN:  Discussed the following assessment and plan:  Dysuria/UTI --patient symptoms do sound consistent with a UTI.  Patient unable to get to the office today to bring urine sample, we will treat her presumptively as a UTI with Omnicef twice daily.  Advised patient to keep up good water intake, wear cotton underwear and always wipe front to back after using restroom.  Also advised to watch sugar intake and caffeine intake as these can exacerbate UTI symptoms.  Discussed diligent handwashing and strongly recommended she wear a mask when going out of the home especially when she goes on her beach trip.  Advised that if her symptoms persist into next week, at that point we would ask her to come in and bring a urine sample to the office.   I discussed the assessment and treatment plan with the patient. The patient was provided an opportunity to ask questions and all were answered. The patient agreed with the plan and demonstrated an understanding of the instructions.   The patient was advised to call back or seek an in-person evaluation if the symptoms worsen or if the condition fails to improve as anticipated.  I provided 15 minutes of non-face-to-face time over phone during this encounter.   Jodelle Green, FNP

## 2019-06-14 ENCOUNTER — Telehealth: Payer: Self-pay

## 2019-06-14 NOTE — Telephone Encounter (Signed)
Left message requesting for patient to call back to switch appt on 06/28/2019 to a day that Dr. Fletcher Anon is seeing patients in the office since she declined to do a telehealth visit.

## 2019-06-28 ENCOUNTER — Ambulatory Visit: Payer: PPO | Admitting: Cardiovascular Disease

## 2019-06-29 DIAGNOSIS — M205X9 Other deformities of toe(s) (acquired), unspecified foot: Secondary | ICD-10-CM | POA: Diagnosis not present

## 2019-06-29 DIAGNOSIS — M2041 Other hammer toe(s) (acquired), right foot: Secondary | ICD-10-CM | POA: Diagnosis not present

## 2019-06-29 DIAGNOSIS — M79671 Pain in right foot: Secondary | ICD-10-CM | POA: Diagnosis not present

## 2019-06-29 DIAGNOSIS — M21611 Bunion of right foot: Secondary | ICD-10-CM | POA: Diagnosis not present

## 2019-06-30 ENCOUNTER — Other Ambulatory Visit (HOSPITAL_COMMUNITY): Payer: Self-pay | Admitting: Orthopedic Surgery

## 2019-06-30 ENCOUNTER — Telehealth: Payer: Self-pay

## 2019-06-30 NOTE — Telephone Encounter (Signed)
Patient confirmed that she is taking crestor 3 times a week

## 2019-07-06 ENCOUNTER — Encounter: Payer: Self-pay | Admitting: Family Medicine

## 2019-07-06 ENCOUNTER — Ambulatory Visit (INDEPENDENT_AMBULATORY_CARE_PROVIDER_SITE_OTHER): Payer: PPO | Admitting: Family Medicine

## 2019-07-06 ENCOUNTER — Other Ambulatory Visit: Payer: Self-pay

## 2019-07-06 ENCOUNTER — Ambulatory Visit: Payer: PPO

## 2019-07-06 DIAGNOSIS — I10 Essential (primary) hypertension: Secondary | ICD-10-CM

## 2019-07-06 DIAGNOSIS — H259 Unspecified age-related cataract: Secondary | ICD-10-CM | POA: Diagnosis not present

## 2019-07-06 DIAGNOSIS — M2041 Other hammer toe(s) (acquired), right foot: Secondary | ICD-10-CM | POA: Insufficient documentation

## 2019-07-06 DIAGNOSIS — F419 Anxiety disorder, unspecified: Secondary | ICD-10-CM

## 2019-07-06 DIAGNOSIS — E785 Hyperlipidemia, unspecified: Secondary | ICD-10-CM

## 2019-07-06 DIAGNOSIS — I6523 Occlusion and stenosis of bilateral carotid arteries: Secondary | ICD-10-CM | POA: Diagnosis not present

## 2019-07-06 DIAGNOSIS — R002 Palpitations: Secondary | ICD-10-CM

## 2019-07-06 DIAGNOSIS — H269 Unspecified cataract: Secondary | ICD-10-CM | POA: Insufficient documentation

## 2019-07-06 NOTE — Assessment & Plan Note (Signed)
Most recent ultrasound with no significant carotid artery plaque.

## 2019-07-06 NOTE — Addendum Note (Signed)
Addended by: Caryl Bis, Mary Hockey G on: 07/06/2019 12:51 PM   Modules accepted: Level of Service

## 2019-07-06 NOTE — Progress Notes (Signed)
Virtual Visit via telephone Note  This visit type was conducted due to national recommendations for restrictions regarding the COVID-19 pandemic (e.g. social distancing).  This format is felt to be most appropriate for this patient at this time.  All issues noted in this document were discussed and addressed.  No physical exam was performed (except for noted visual exam findings with Video Visits).   I connected with Jessica Reynolds today at  8:30 AM EDT by telephone and verified that I am speaking with the correct person using two identifiers. Location patient: home Location provider: work Persons participating in the virtual visit: patient, provider  I discussed the limitations, risks, security and privacy concerns of performing an evaluation and management service by telephone and the availability of in person appointments. I also discussed with the patient that there may be a patient responsible charge related to this service. The patient expressed understanding and agreed to proceed.  Interactive audio and video telecommunications were attempted between this provider and patient, however failed, due to patient having technical difficulties OR patient did not have access to video capability.  We continued and completed visit with audio only.  Reason for visit: follow-up  HPI: HYPERTENSION  Disease Monitoring  Home BP Monitoring not checking Chest pain- no    Dyspnea- no Medications  Compliance-  Taking triamterene/HCTZ, metoprolol.  Edema- no  Palpitations: feels they are brought on by stress and anxiety.  Does not typically have them unless she is stressed or anxious.  Medical issues bring the stress on and notes anxiety surrounding these issues. Has quite a bit of anxiety surrounding her handicapped son. Feels as though she can still manage it.  She has been trying to schedule an appointment with cardiology.  HYPERLIPIDEMIA Symptoms Chest pain on exertion:  no   Medications:  Compliance- crestor Right upper quadrant pain- no  Muscle aches- no  Hammer toe on right foot: has seen a specialist in [place]. Scheduled for 8/13 for amputation. Can't get head used to this idea. Trying to get appointment with Dr Dennie Maizes at [facility] to get a second opinion. Only has pain when she wears shoes. No medication for the pain. Wears sandals to account for this. Some swelling associated with this.   Cataracts: ready to be removed. Has seen optho for this. Has macular degeneration.   ROS: See pertinent positives and negatives per HPI.  Past Medical History:  Diagnosis Date  . Arthritis   . Colon polyp   . UTI (lower urinary tract infection)     Past Surgical History:  Procedure Laterality Date  . ABDOMINAL HYSTERECTOMY  1980   BSO, fibroids  . ACHILLES TENDON SURGERY Right 1990  . ACHILLES TENDON SURGERY Left 1997  . [place]  . VAGINAL DELIVERY     4    Family History  Problem Relation Age of Onset  . Stroke Mother   . Heart disease Father   . Macular degeneration Sister   . Diabetes Sister   . Breast cancer Neg Hx     SOCIAL HX: nonsmoker   Current Outpatient Medications:  .  aspirin EC 81 MG tablet, Take 81 mg by mouth daily., Disp: , Rfl:  .  Calcium-Vitamin D-Vitamin K (VIACTIV PO), Take 1 capsule by mouth daily., Disp: , Rfl:  .  Cholecalciferol (VITAMIN D3 PO), Take 125 mg by mouth daily., Disp: , Rfl:  .  fluticasone (FLONASE) 50 MCG/ACT nasal spray, USE TWO SPRAY(S) IN EACH NOSTRIL ONCE  DAILY, Disp: 16 g, Rfl: 6 .  metoprolol succinate (TOPROL-XL) 50 MG 24 hr tablet, Take 1 tablet by mouth once daily, Disp: 90 tablet, Rfl: 0 .  Multiple Vitamins-Minerals (PRESERVISION/LUTEIN) CAPS, Take 2 capsules by mouth daily., Disp: , Rfl:  .  rosuvastatin (CRESTOR) 10 MG tablet, TAKE TWO TABLETS BY MOUTH THREE TIMES WEEKLY, Disp: 48 tablet, Rfl: 0 .  triamterene-hydrochlorothiazide (MAXZIDE-25) 37.5-25 MG tablet, TAKE 1/2 (ONE-HALF) TABLET BY  MOUTH IN THE MORNING, Disp: 45 tablet, Rfl: 0  EXAM: This was a telehealth telephone visit and thus a physical exam was not completed.   ASSESSMENT AND PLAN:  Discussed the following assessment and plan:  Benign essential HTN Blood pressure has been well controlled previously.  She will continue her current regimen.  Carotid artery plaque, bilateral Most recent ultrasound with no significant carotid artery plaque.  Anxiety Patient defers treatment for this.  This is likely contributing to her palpitations.  She will monitor.  Palpitations Likely related to anxiety and stress though she will schedule an appointment with cardiology for follow-up.  HLD (hyperlipidemia) Continue Crestor.  Hammertoe of right foot The patient is unsure about having an amputation.  I did encourage her to seek a second opinion at [facility] prior to having her surgery.  She will contact the [place] office to see if there has potentially been a cancellation.  I did advise her that if she is not going to keep her surgery date for August she needs to give them plenty of notice when canceling.  Cataracts, bilateral She will continue to follow with ophthalmology.   Social distancing precautions and sick precautions given regarding COVID-19.   I discussed the assessment and treatment plan with the patient. The patient was provided an opportunity to ask questions and all were answered. The patient agreed with the plan and demonstrated an understanding of the instructions.   The patient was advised to call back or seek an in-person evaluation if the symptoms worsen or if the condition fails to improve as anticipated.  I provided 17 minutes of non-face-to-face time during this encounter.   Tommi Rumps, MD

## 2019-07-06 NOTE — Assessment & Plan Note (Signed)
Patient defers treatment for this.  This is likely contributing to her palpitations.  She will monitor.

## 2019-07-06 NOTE — Assessment & Plan Note (Signed)
Blood pressure has been well controlled previously.  She will continue her current regimen.

## 2019-07-06 NOTE — Assessment & Plan Note (Signed)
Likely related to anxiety and stress though she will schedule an appointment with cardiology for follow-up.

## 2019-07-06 NOTE — Assessment & Plan Note (Signed)
She will continue to follow with ophthalmology 

## 2019-07-06 NOTE — Assessment & Plan Note (Signed)
Continue Crestor 

## 2019-07-06 NOTE — Assessment & Plan Note (Signed)
The patient is unsure about having an amputation.  I did encourage her to seek a second opinion at [facility] prior to having her surgery.  She will contact the [place] office to see if there has potentially been a cancellation.  I did advise her that if she is not going to keep her surgery date for August she needs to give them plenty of notice when canceling.

## 2019-07-07 ENCOUNTER — Ambulatory Visit: Payer: PPO

## 2019-07-07 ENCOUNTER — Telehealth: Payer: Self-pay | Admitting: Family Medicine

## 2019-07-07 ENCOUNTER — Ambulatory Visit (INDEPENDENT_AMBULATORY_CARE_PROVIDER_SITE_OTHER): Payer: PPO

## 2019-07-07 DIAGNOSIS — Z0000 Encounter for general adult medical examination without abnormal findings: Secondary | ICD-10-CM | POA: Diagnosis not present

## 2019-07-07 MED ORDER — ROSUVASTATIN CALCIUM 10 MG PO TABS: ORAL_TABLET | ORAL | 1 refills | Status: DC

## 2019-07-07 NOTE — Telephone Encounter (Signed)
Called and spoke with the patient about how she is taking her Crestor. Patient states the bottle says to take 2 tablets 3 times per week and its 10 mg, patient states she only takes one tablet 3 times per week.  She states she does not take 2 tablets because it makes her joints ache.  Yuto Cajuste,cma

## 2019-07-07 NOTE — Telephone Encounter (Signed)
Can you contact the patient and see how often she is taking her crestor so we can get the prescription information correct? Thanks.

## 2019-07-07 NOTE — Addendum Note (Signed)
Addended by: Leone Haven on: 07/07/2019 04:40 PM   Modules accepted: Orders

## 2019-07-07 NOTE — Progress Notes (Signed)
Subjective:   MELVA FAUX is a 85 y.o. female who presents for Medicare Annual (Subsequent) preventive examination.  Review of Systems:  No ROS.  Medicare Wellness Virtual Visit.  Visual/audio telehealth visit, UTA vital signs.   See social history for additional risk factors.   Cardiac Risk Factors include: advanced age (>74men, >61 women);hypertension     Objective:     Vitals: There were no vitals taken for this visit.  There is no height or weight on file to calculate BMI.  Advanced Directives 07/07/2019 07/05/2018 01/09/2016  Does Patient Have a Medical Advance Directive? Yes Yes No;Yes  Type of Paramedic of [place];Living will [place] will  Does patient want to make changes to medical advance directive? No - Patient declined No - Patient declined -  Copy of [place] in Chart? No - copy requested No - copy requested -    Tobacco Social History   Tobacco Use  Smoking Status Never Smoker  Smokeless Tobacco Never Used     Counseling given: Not Answered   Clinical Intake:  Pre-visit preparation completed: Yes        Diabetes: No  How often do you need to have someone help you when you read instructions, pamphlets, or other written materials from your doctor or pharmacy?: 1 - Never  Interpreter Needed?: No     Past Medical History:  Diagnosis Date  . Arthritis   . Colon polyp   . UTI (lower urinary tract infection)    Past Surgical History:  Procedure Laterality Date  . ABDOMINAL HYSTERECTOMY  1980   BSO, fibroids  . ACHILLES TENDON SURGERY Right 1990  . ACHILLES TENDON SURGERY Left 1997  . [place]  . VAGINAL DELIVERY     4   Family History  Problem Relation Age of Onset  . Stroke Mother   . Heart disease Father   . Macular degeneration Sister   . Diabetes Sister   . Breast cancer Neg Hx    Social History   Socioeconomic History  .  Marital status: Widowed    Spouse name: Not on file  . Number of children: Not on file  . Years of education: Not on file  . Highest education level: Not on file  Occupational History  . Not on file  Social Needs  . Financial resource strain: Not hard at all  . Food insecurity    Worry: Never true    Inability: Never true  . Transportation needs    Medical: No    Non-medical: No  Tobacco Use  . Smoking status: Never Smoker  . Smokeless tobacco: Never Used  Substance and Sexual Activity  . Alcohol use: Yes    Comment: social  . Drug use: No  . Sexual activity: Not on file  Lifestyle  . Physical activity    Days per week: 3 days    Minutes per session: 120 min  . Stress: Not at all  Relationships  . Social Herbalist on phone: Not on file    Gets together: Not on file    Attends religious service: Not on file    Active member of club or organization: Not on file    Attends meetings of clubs or organizations: Not on file    Relationship status: Not on file  Other Topics Concern  . Not on file  Social History Narrative   Born in  [place], [place].      Lives in [place] with son. Husband passed away 03/17/2012. No pets.      Work - retired for [employer] - regular      Exercise - tennis,  3-5 days per week    Outpatient Encounter Medications as of 07/07/2019  Medication Sig  . aspirin EC 81 MG tablet Take 81 mg by mouth daily.  . Calcium-Vitamin D-Vitamin K (VIACTIV PO) Take 1 capsule by mouth daily.  . Cholecalciferol (VITAMIN D3 PO) Take 125 mg by mouth daily.  . fluticasone (FLONASE) 50 MCG/ACT nasal spray USE TWO SPRAY(S) IN EACH NOSTRIL ONCE DAILY  . metoprolol succinate (TOPROL-XL) 50 MG 24 hr tablet Take 1 tablet by mouth once daily  . Multiple Vitamins-Minerals (PRESERVISION/LUTEIN) CAPS Take 2 capsules by mouth daily.  . rosuvastatin (CRESTOR) 10 MG tablet TAKE TWO TABLETS BY MOUTH THREE TIMES WEEKLY  .  triamterene-hydrochlorothiazide (MAXZIDE-25) 37.5-25 MG tablet TAKE 1/2 (ONE-HALF) TABLET BY MOUTH IN THE MORNING   No facility-administered encounter medications on file as of 07/07/2019.     Activities of Daily Living In your present state of health, do you have any difficulty performing the following activities: 07/07/2019  Hearing? Y  Comment Hearing aids  Vision? N  Difficulty concentrating or making decisions? N  Walking or climbing stairs? N  Dressing or bathing? N  Doing errands, shopping? N  Preparing Food and eating ? N  Using the Toilet? N  In the past six months, have you accidently leaked urine? N  Do you have problems with loss of bowel control? N  Managing your Medications? N  Managing your Finances? N  Housekeeping or managing your Housekeeping? N  Some recent data might be hidden    Patient Care Team: Leone Haven, MD as PCP - General (Family Medicine) Jackolyn Confer, MD (Internal Medicine)    Assessment:   This is a routine wellness examination for [place].  I connected with patient 07/07/19 at 12:30 PM EDT by an audio enabled telemedicine application and verified that I am speaking with the correct person using two identifiers. Patient stated full name and DOB. Patient gave permission to continue with virtual visit. Patient's location was at home and Nurse's location was at [place] office.   Health Screenings  Mammogram 3D- 01/2018 Colonoscopy - 12/2010 Bone Density - 06/2017 Glaucoma -none Macular degeneration- yes Hearing - hearing aids Hemoglobin A1C - 01/10/19 (5.9) Cholesterol - 12/2018 Dental- UTD Vision- visits within the last 12 months.  Social  Alcohol intake - yes      Smoking history- never   Smokers in home? none Illicit drug use? none Exercise - plays tennis 3 days weeks, 2 hours  Diet - low carb ; no white foods  Sexually Active -not currently BMI- discussed the importance of a healthy diet, water intake and the benefits of  aerobic exercise.  Educational material provided.   Safety  Patient feels safe at home- yes Patient does have smoke detectors at home- yes Patient does wear sunscreen or protective clothing when in direct sunlight -yes Patient does wear seat belt when in a moving vehicle -yes Patient drives- yes  [id number] precautions and sickness symptoms discussed.   Activities of Daily Living Patient denies needing assistance with: driving, household chores, feeding themselves, getting from bed to chair, getting to the toilet, bathing/showering, dressing, managing money, or preparing meals.  No new identified risk were noted.    Depression  Screen Patient denies losing interest in daily life, feeling hopeless, or crying easily over simple problems.   Medication-taking as directed and without issues.   Fall Screen Patient denies being afraid of falling or falling in the last year.   Memory Screen Patient is alert.  Patient denies difficulty focusing, concentrating or misplacing items. Correctly identified the president of the [place], season and recall. Patient likes to read and keeps a vigorous schedule with her handicap son which assists with brain stimulation.  Immunizations The following Immunizations were discussed: Influenza, shingles, pneumonia, and tetanus.   Other Providers Patient Care Team: Leone Haven, MD as PCP - General (Family Medicine) Jackolyn Confer, MD (Internal Medicine)  Exercise Activities and Dietary recommendations Current Exercise Habits: Home exercise routine, Time (Minutes): > 60, Frequency (Times/Week): 3, Weekly Exercise (Minutes/Week): 0, Intensity: Mild, Exercise limited by: None identified  Goals      Patient Stated   . Follow up with Primary Care Provider (pt-stated)     Follow up as needed Wear hearing aids more often for better quality of life       Fall Risk Fall Risk  07/06/2019 05/31/2019 01/10/2019 07/05/2018 06/09/2017  Falls in the past  year? 0 0 0 No No  Number falls in past yr: 0 0 0 - -  Injury with Fall? - 0 0 - -  Follow up - Falls evaluation completed - - -   Is the patient's home free of loose throw rugs in walkways, pet beds, electrical cords, etc? yes      Grab bars in the bathroom? yes      Handrails on the stairs? yes      Adequate lighting? yes  Depression Screen PHQ 2/9 Scores 07/06/2019 05/31/2019 01/10/2019 07/05/2018  PHQ - 2 Score 0 0 0 0  PHQ- 9 Score - 0 - -     Cognitive Function     6CIT Screen 07/07/2019 07/05/2018  What Year? 0 points 0 points  What month? 0 points 0 points  What time? 0 points 0 points  Count back from 20 0 points 0 points  Months in reverse 0 points 0 points  Repeat phrase 0 points 0 points  Total Score 0 0    Immunization History  Administered Date(s) Administered  . Influenza Split 09/14/2014, 10/11/2014  . Influenza, High Dose Seasonal PF 11/16/2017, 11/05/2018  . Influenza,inj,Quad PF,6+ Mos 10/24/2015  . Influenza-Unspecified 12/22/2016  . Pneumococcal Conjugate-13 01/28/2016  . Pneumococcal Polysaccharide-23 12/15/1998, 03/12/2011  . Td 01/18/2013  . Zoster 07/05/2007, 01/22/2009   Screening Tests Health Maintenance  Topic Date Due  . INFLUENZA VACCINE  07/16/2019  . TETANUS/TDAP  01/18/2023  . DEXA SCAN  Completed  . PNA vac Low Risk Adult  Completed     Plan:    End of life planning; Advance aging; Advanced directives discussed.  Copy of current HCPOA/Living Will requested.    I have personally reviewed and noted the following in the patient's chart:   . Medical and social history . Use of alcohol, tobacco or illicit drugs  . Current medications and supplements . Functional ability and status . Nutritional status . Physical activity . Advanced directives . List of other physicians . Hospitalizations, surgeries, and ER visits in previous 12 months . Vitals . Screenings to include cognitive, depression, and falls . Referrals and appointments   In addition, I have reviewed and discussed with patient certain preventive protocols, quality metrics, and best practice recommendations. A written personalized care  plan for preventive services as well as general preventive health recommendations were provided to patient.     Varney Biles, LPN  9/37/[id number]

## 2019-07-07 NOTE — Telephone Encounter (Signed)
-----   Message from De Hollingshead, [facility] sent at 06/23/2019  4:41 PM EDT ----- Juluis Rainier for this patient, you both see her 7/22:   I'm getting sent the quality measure medication adherence files. This patient is flagging for "nonadherence" to her statin. Looks like the pharmacy is appropriately filling the 6 tablets per week (48 tabs for a 56 day supply), but she's only filled on 01/13/2019 and 04/12/2019. On the 12/2018 LDL lab result, it looks like she may have been increased to taking rosuvastatin 4 times a week? But then she would have run out sooner. It may just been an issue of needing to update the prescription now to reflect what she is actually taking to achieve goal LDL.  I don't want someone (especially from [facility]) to cold call her and harass about adherence. When you all see her, see if you can assess what she's doing? I'd be happy to outreach and talk to her to, but I think a warm handoff from y'all would be best.   Thanks!  Catie

## 2019-07-07 NOTE — Telephone Encounter (Signed)
Noted. Prescription updated.

## 2019-07-07 NOTE — Patient Instructions (Addendum)
  Ms. Bouldin , Thank you for taking time to come for your Medicare Wellness Visit. I appreciate your ongoing commitment to your health goals. Please review the following plan we discussed and let me know if I can assist you in the future.   These are the goals we discussed: Goals      Patient Stated   . Follow up with Primary Care Provider (pt-stated)     Follow up as needed Wear hearing aids more often for better quality of life       This is a list of the screening recommended for you and due dates:  Health Maintenance  Topic Date Due  . Flu Shot  07/16/2019  . Tetanus Vaccine  01/18/2023  . DEXA scan (bone density measurement)  Completed  . Pneumonia vaccines  Completed

## 2019-07-18 ENCOUNTER — Other Ambulatory Visit: Payer: Self-pay | Admitting: Family Medicine

## 2019-07-28 ENCOUNTER — Encounter (HOSPITAL_BASED_OUTPATIENT_CLINIC_OR_DEPARTMENT_OTHER): Payer: Self-pay

## 2019-07-28 ENCOUNTER — Ambulatory Visit (HOSPITAL_BASED_OUTPATIENT_CLINIC_OR_DEPARTMENT_OTHER): Admit: 2019-07-28 | Payer: PPO | Admitting: Orthopedic Surgery

## 2019-07-28 SURGERY — AMPUTATION, TOE
Anesthesia: Monitor Anesthesia Care | Laterality: Right

## 2019-08-09 ENCOUNTER — Ambulatory Visit: Payer: PPO | Admitting: Cardiovascular Disease

## 2019-08-18 DIAGNOSIS — M205X1 Other deformities of toe(s) (acquired), right foot: Secondary | ICD-10-CM | POA: Diagnosis not present

## 2019-08-18 DIAGNOSIS — M2012 Hallux valgus (acquired), left foot: Secondary | ICD-10-CM | POA: Diagnosis not present

## 2019-08-18 DIAGNOSIS — M2011 Hallux valgus (acquired), right foot: Secondary | ICD-10-CM | POA: Diagnosis not present

## 2019-08-29 DIAGNOSIS — H2513 Age-related nuclear cataract, bilateral: Secondary | ICD-10-CM | POA: Diagnosis not present

## 2019-08-29 DIAGNOSIS — H353131 Nonexudative age-related macular degeneration, bilateral, early dry stage: Secondary | ICD-10-CM | POA: Diagnosis not present

## 2019-09-02 DIAGNOSIS — I10 Essential (primary) hypertension: Secondary | ICD-10-CM | POA: Diagnosis not present

## 2019-09-02 DIAGNOSIS — H2511 Age-related nuclear cataract, right eye: Secondary | ICD-10-CM | POA: Diagnosis not present

## 2019-09-07 NOTE — Anesthesia Preprocedure Evaluation (Addendum)
Anesthesia Evaluation  Patient identified by MRN, date of birth, ID band Patient awake    Reviewed: Allergy & Precautions, NPO status , Patient's Chart, lab work & pertinent test results  History of Anesthesia Complications Negative for: history of anesthetic complications  Airway Mallampati: I   Neck ROM: Full    Dental no notable dental hx.    Pulmonary neg pulmonary ROS,    Pulmonary exam normal breath sounds clear to auscultation       Cardiovascular hypertension, Normal cardiovascular exam Rhythm:Regular Rate:Normal  Palpitations    Neuro/Psych  Headaches, PSYCHIATRIC DISORDERS Anxiety HOH    GI/Hepatic negative GI ROS,   Endo/Other  negative endocrine ROS  Renal/GU negative Renal ROS     Musculoskeletal  (+) Arthritis ,   Abdominal   Peds  Hematology negative hematology ROS (+)   Anesthesia Other Findings   Reproductive/Obstetrics                            Anesthesia Physical Anesthesia Plan  ASA: II  Anesthesia Plan: MAC   Post-op Pain Management:    Induction: Intravenous  PONV Risk Score and Plan: 2 and Midazolam and TIVA  Airway Management Planned: Natural Airway  Additional Equipment:   Intra-op Plan:   Post-operative Plan:   Informed Consent: I have reviewed the patients History and Physical, chart, labs and discussed the procedure including the risks, benefits and alternatives for the proposed anesthesia with the patient or authorized representative who has indicated his/her understanding and acceptance.       Plan Discussed with: CRNA  Anesthesia Plan Comments:        Anesthesia Quick Evaluation

## 2019-09-09 ENCOUNTER — Other Ambulatory Visit
Admission: RE | Admit: 2019-09-09 | Discharge: 2019-09-09 | Disposition: A | Discharge status: Home / Self Care | Payer: PPO | Source: Ambulatory Visit | Attending: Ophthalmology | Admitting: Ophthalmology

## 2019-09-09 DIAGNOSIS — Z20828 Contact with and (suspected) exposure to other viral communicable diseases: Secondary | ICD-10-CM | POA: Insufficient documentation

## 2019-09-09 DIAGNOSIS — Z01812 Encounter for preprocedural laboratory examination: Secondary | ICD-10-CM | POA: Insufficient documentation

## 2019-09-09 NOTE — Discharge Instructions (Signed)

## 2019-09-10 LAB — SARS CORONAVIRUS 2 (TAT 6-24 HRS): SARS Coronavirus 2: NEGATIVE

## 2019-09-13 ENCOUNTER — Other Ambulatory Visit: Payer: Self-pay

## 2019-09-13 ENCOUNTER — Ambulatory Visit: Payer: PPO | Admitting: Anesthesiology

## 2019-09-13 ENCOUNTER — Ambulatory Visit
Admission: RE | Admit: 2019-09-13 | Discharge: 2019-09-13 | Disposition: A | Discharge status: Home / Self Care | Payer: PPO | Attending: Ophthalmology | Admitting: Ophthalmology

## 2019-09-13 ENCOUNTER — Encounter
Admission: RE | Disposition: A | Discharge status: Home / Self Care | Payer: Self-pay | Source: Home / Self Care | Attending: Ophthalmology

## 2019-09-13 DIAGNOSIS — Z79899 Other long term (current) drug therapy: Secondary | ICD-10-CM | POA: Insufficient documentation

## 2019-09-13 DIAGNOSIS — F419 Anxiety disorder, unspecified: Secondary | ICD-10-CM | POA: Insufficient documentation

## 2019-09-13 DIAGNOSIS — H2511 Age-related nuclear cataract, right eye: Secondary | ICD-10-CM | POA: Diagnosis not present

## 2019-09-13 DIAGNOSIS — I10 Essential (primary) hypertension: Secondary | ICD-10-CM | POA: Insufficient documentation

## 2019-09-13 DIAGNOSIS — H9190 Unspecified hearing loss, unspecified ear: Secondary | ICD-10-CM | POA: Insufficient documentation

## 2019-09-13 DIAGNOSIS — H25811 Combined forms of age-related cataract, right eye: Secondary | ICD-10-CM | POA: Diagnosis not present

## 2019-09-13 DIAGNOSIS — M1990 Unspecified osteoarthritis, unspecified site: Secondary | ICD-10-CM | POA: Diagnosis not present

## 2019-09-13 HISTORY — DX: Headache, unspecified: R51.9

## 2019-09-13 HISTORY — DX: Pure hypercholesterolemia, unspecified: E78.00

## 2019-09-13 HISTORY — DX: Essential (primary) hypertension: I10

## 2019-09-13 HISTORY — PX: CATARACT EXTRACTION W/PHACO: SHX586

## 2019-09-13 HISTORY — DX: Unspecified hearing loss, unspecified ear: H91.90

## 2019-09-13 HISTORY — DX: Unspecified osteoarthritis, unspecified site: M19.90

## 2019-09-13 HISTORY — DX: Cardiac arrhythmia, unspecified: I49.9

## 2019-09-13 SURGERY — PHACOEMULSIFICATION, CATARACT, WITH IOL INSERTION
Anesthesia: Monitor Anesthesia Care | Site: Eye | Laterality: Right

## 2019-09-13 MED ORDER — LIDOCAINE HCL (PF) 2 % IJ SOLN
INTRAOCULAR | Status: DC | PRN
  Administered 2019-09-13: 2 mL

## 2019-09-13 MED ORDER — TETRACAINE HCL 0.5 % OP SOLN
1.0000 [drp] | OPHTHALMIC | Status: DC | PRN
  Administered 2019-09-13 (×3): 1 [drp] via OPHTHALMIC

## 2019-09-13 MED ORDER — FENTANYL CITRATE (PF) 100 MCG/2ML IJ SOLN
INTRAMUSCULAR | Status: DC | PRN
  Administered 2019-09-13: 50 ug via INTRAVENOUS

## 2019-09-13 MED ORDER — ACETAMINOPHEN 325 MG PO TABS: 650.0000 mg | ORAL_TABLET | Freq: Once | ORAL | Status: DC | PRN

## 2019-09-13 MED ORDER — ONDANSETRON HCL 4 MG/2ML IJ SOLN: 4.0000 mg | Freq: Once | INTRAMUSCULAR | Status: DC | PRN

## 2019-09-13 MED ORDER — MOXIFLOXACIN HCL 0.5 % OP SOLN
OPHTHALMIC | Status: DC | PRN
  Administered 2019-09-13: 0.2 mL via OPHTHALMIC

## 2019-09-13 MED ORDER — ACETAMINOPHEN 160 MG/5ML PO SOLN: 325.0000 mg | ORAL | Status: DC | PRN

## 2019-09-13 MED ORDER — BRIMONIDINE TARTRATE-TIMOLOL 0.2-0.5 % OP SOLN
OPHTHALMIC | Status: DC | PRN
  Administered 2019-09-13: 1 [drp] via OPHTHALMIC

## 2019-09-13 MED ORDER — EPINEPHRINE PF 1 MG/ML IJ SOLN
INTRAOCULAR | Status: DC | PRN
  Administered 2019-09-13: 101 mL via OPHTHALMIC

## 2019-09-13 MED ORDER — MIDAZOLAM HCL 2 MG/2ML IJ SOLN
INTRAMUSCULAR | Status: DC | PRN
  Administered 2019-09-13: 1 mg via INTRAVENOUS

## 2019-09-13 MED ORDER — NA CHONDROIT SULF-NA HYALURON 40-17 MG/ML IO SOLN
INTRAOCULAR | Status: DC | PRN
  Administered 2019-09-13: 1 mL via INTRAOCULAR

## 2019-09-13 MED ORDER — ARMC OPHTHALMIC DILATING DROPS
1.0000 "application " | OPHTHALMIC | Status: DC | PRN
  Administered 2019-09-13 (×3): 1 via OPHTHALMIC

## 2019-09-13 SURGICAL SUPPLY — 18 items
CANNULA ANT/CHMB 27GA (MISCELLANEOUS) ×6 IMPLANT
GLOVE SURG LX 8.0 MICRO (GLOVE) ×2
GLOVE SURG LX STRL 8.0 MICRO (GLOVE) ×1 IMPLANT
GLOVE SURG TRIUMPH 8.0 PF LTX (GLOVE) ×3 IMPLANT
GOWN STRL REUS W/ TWL LRG LVL3 (GOWN DISPOSABLE) ×2 IMPLANT
GOWN STRL REUS W/TWL LRG LVL3 (GOWN DISPOSABLE) ×4
LENS IOL TECNIS ITEC 21.0 (Intraocular Lens) ×3 IMPLANT
MARKER SKIN DUAL TIP RULER LAB (MISCELLANEOUS) ×3 IMPLANT
NDL RETROBULBAR .5 NSTRL (NEEDLE) ×3 IMPLANT
NEEDLE FILTER BLUNT 18X 1/2SAF (NEEDLE) ×2
NEEDLE FILTER BLUNT 18X1 1/2 (NEEDLE) ×1 IMPLANT
PACK EYE AFTER SURG (MISCELLANEOUS) ×3 IMPLANT
PACK OPTHALMIC (MISCELLANEOUS) ×3 IMPLANT
PACK PORFILIO (MISCELLANEOUS) ×3 IMPLANT
SYR 3ML LL SCALE MARK (SYRINGE) ×3 IMPLANT
SYR TB 1ML LUER SLIP (SYRINGE) ×3 IMPLANT
WATER STERILE IRR 250ML POUR (IV SOLUTION) ×3 IMPLANT
WIPE NON LINTING 3.25X3.25 (MISCELLANEOUS) ×3 IMPLANT

## 2019-09-13 NOTE — Transfer of Care (Signed)
Immediate Anesthesia Transfer of Care Note  Patient: Jessica Reynolds  Procedure(s) Performed: CATARACT EXTRACTION PHACO AND INTRAOCULAR LENS PLACEMENT (IOC) RIGHT (Right Eye)  Patient Location: PACU  Anesthesia Type: MAC  Level of Consciousness: awake, alert  and patient cooperative  Airway and Oxygen Therapy: Patient Spontanous Breathing and Patient connected to supplemental oxygen  Post-op Assessment: Post-op Vital signs reviewed, Patient's Cardiovascular Status Stable, Respiratory Function Stable, Patent Airway and No signs of Nausea or vomiting  Post-op Vital Signs: Reviewed and stable  Complications: No apparent anesthesia complications

## 2019-09-13 NOTE — Op Note (Signed)
PREOPERATIVE DIAGNOSIS:  Nuclear sclerotic cataract of the right eye.   POSTOPERATIVE DIAGNOSIS:  H25.11 Cataract   OPERATIVE PROCEDURE:@   SURGEON:  Birder Robson, MD.   ANESTHESIA:  Anesthesiologist: Darrin Nipper, MD CRNA: Cameron Ali, CRNA  1.      Managed anesthesia care. 2.      0.57ml of Shugarcaine was instilled in the eye following the paracentesis.   COMPLICATIONS:  None.   TECHNIQUE:   Stop and chop   DESCRIPTION OF PROCEDURE:  The patient was examined and consented in the preoperative holding area where the aforementioned topical anesthesia was applied to the right eye and then brought back to the Operating Room where the right eye was prepped and draped in the usual sterile ophthalmic fashion and a lid speculum was placed. A paracentesis was created with the side port blade and the anterior chamber was filled with viscoelastic. A near clear corneal incision was performed with the steel keratome. A continuous curvilinear capsulorrhexis was performed with a cystotome followed by the capsulorrhexis forceps. Hydrodissection and hydrodelineation were carried out with BSS on a blunt cannula. The lens was removed in a stop and chop  technique and the remaining cortical material was removed with the irrigation-aspiration handpiece. The capsular bag was inflated with viscoelastic and the Technis [id number]  lens was placed in the capsular bag without complication. The remaining viscoelastic was removed from the eye with the irrigation-aspiration handpiece. The wounds were hydrated. The anterior chamber was flushed with BSS and the eye was inflated to physiologic pressure. 0.98ml of Vigamox was placed in the anterior chamber. The wounds were found to be water tight. The eye was dressed with Combigan. The patient was given protective glasses to wear throughout the day and a shield with which to sleep tonight. The patient was also given drops with which to begin a drop regimen today and will  follow-up with me in one day. Implant Name Type Inv. Item Serial No. Manufacturer Lot No. LRB No. Used Action  LENS IOL DIOP 21.0 - [id number] Intraocular Lens LENS IOL DIOP 21.0 [id number]  Right 1 Implanted   Procedure(s): CATARACT EXTRACTION PHACO AND INTRAOCULAR LENS PLACEMENT (IOC) RIGHT 01:01.4           11.2%           6.93 (Right)  Electronically signed: Birder Robson 09/13/2019 12:42 PM

## 2019-09-13 NOTE — Anesthesia Postprocedure Evaluation (Signed)
Anesthesia Post Note  Patient: Jessica Reynolds  Procedure(s) Performed: CATARACT EXTRACTION PHACO AND INTRAOCULAR LENS PLACEMENT (IOC) RIGHT 01:01.4           11.2%           6.93 (Right Eye)  Patient location during evaluation: PACU Anesthesia Type: MAC Level of consciousness: awake and alert, oriented and patient cooperative Pain management: pain level controlled Vital Signs Assessment: post-procedure vital signs reviewed and stable Respiratory status: spontaneous breathing, nonlabored ventilation and respiratory function stable Cardiovascular status: blood pressure returned to baseline and stable Postop Assessment: adequate PO intake Anesthetic complications: no    Darrin Nipper

## 2019-09-13 NOTE — OR Nursing (Signed)
Spoke with Dr. George Ina regarding pt's allergy to Celebrex, rash & hives.  He asked pt if she can take aspirin, she said yes. Dr. George Ina ok'd to proceed with eye drops as ordered.

## 2019-09-13 NOTE — Anesthesia Procedure Notes (Signed)
Procedure Name: MAC Date/Time: 09/13/2019 11:06 AM Performed by: Cameron Ali, CRNA Pre-anesthesia Checklist: Patient identified, Emergency Drugs available, Suction available, Timeout performed and Patient being monitored Patient Re-evaluated:Patient Re-evaluated prior to induction Oxygen Delivery Method: Nasal cannula Placement Confirmation: positive ETCO2

## 2019-09-13 NOTE — H&P (Signed)
All labs reviewed. Abnormal studies sent to patients PCP when indicated.  Previous H&P reviewed, patient examined, there are NO CHANGES.  Jessica Altieri Porfilio9/29/202011:00 AM

## 2019-09-21 DIAGNOSIS — I10 Essential (primary) hypertension: Secondary | ICD-10-CM | POA: Diagnosis not present

## 2019-09-21 DIAGNOSIS — H2512 Age-related nuclear cataract, left eye: Secondary | ICD-10-CM | POA: Diagnosis not present

## 2019-09-26 ENCOUNTER — Other Ambulatory Visit: Payer: Self-pay

## 2019-09-26 ENCOUNTER — Encounter: Payer: Self-pay | Admitting: *Deleted

## 2019-09-30 ENCOUNTER — Other Ambulatory Visit
Admission: RE | Admit: 2019-09-30 | Discharge: 2019-09-30 | Disposition: A | Discharge status: Home / Self Care | Payer: PPO | Source: Ambulatory Visit | Attending: Ophthalmology | Admitting: Ophthalmology

## 2019-09-30 ENCOUNTER — Other Ambulatory Visit: Payer: Self-pay

## 2019-09-30 DIAGNOSIS — Z20828 Contact with and (suspected) exposure to other viral communicable diseases: Secondary | ICD-10-CM | POA: Insufficient documentation

## 2019-09-30 DIAGNOSIS — Z01812 Encounter for preprocedural laboratory examination: Secondary | ICD-10-CM | POA: Insufficient documentation

## 2019-10-01 LAB — SARS CORONAVIRUS 2 (TAT 6-24 HRS): SARS Coronavirus 2: NEGATIVE

## 2019-10-04 ENCOUNTER — Ambulatory Visit
Admission: RE | Admit: 2019-10-04 | Discharge: 2019-10-04 | Disposition: A | Discharge status: Home / Self Care | Payer: PPO | Attending: Ophthalmology | Admitting: Ophthalmology

## 2019-10-04 ENCOUNTER — Encounter
Admission: RE | Disposition: A | Discharge status: Home / Self Care | Payer: Self-pay | Source: Home / Self Care | Attending: Ophthalmology

## 2019-10-04 ENCOUNTER — Ambulatory Visit: Payer: PPO | Admitting: Anesthesiology

## 2019-10-04 ENCOUNTER — Other Ambulatory Visit: Payer: Self-pay

## 2019-10-04 DIAGNOSIS — Z7982 Long term (current) use of aspirin: Secondary | ICD-10-CM | POA: Diagnosis not present

## 2019-10-04 DIAGNOSIS — H2512 Age-related nuclear cataract, left eye: Secondary | ICD-10-CM | POA: Insufficient documentation

## 2019-10-04 DIAGNOSIS — H25812 Combined forms of age-related cataract, left eye: Secondary | ICD-10-CM | POA: Diagnosis not present

## 2019-10-04 DIAGNOSIS — E7800 Pure hypercholesterolemia, unspecified: Secondary | ICD-10-CM | POA: Diagnosis not present

## 2019-10-04 DIAGNOSIS — I10 Essential (primary) hypertension: Secondary | ICD-10-CM | POA: Insufficient documentation

## 2019-10-04 HISTORY — PX: CATARACT EXTRACTION W/PHACO: SHX586

## 2019-10-04 SURGERY — PHACOEMULSIFICATION, CATARACT, WITH IOL INSERTION
Anesthesia: Monitor Anesthesia Care | Site: Eye | Laterality: Left

## 2019-10-04 MED ORDER — MIDAZOLAM HCL 2 MG/2ML IJ SOLN
INTRAMUSCULAR | Status: DC | PRN
  Administered 2019-10-04: 1 mg via INTRAVENOUS

## 2019-10-04 MED ORDER — ARMC OPHTHALMIC DILATING DROPS
1.0000 "application " | OPHTHALMIC | Status: DC | PRN
  Administered 2019-10-04 (×3): 1 via OPHTHALMIC

## 2019-10-04 MED ORDER — EPINEPHRINE PF 1 MG/ML IJ SOLN
INTRAOCULAR | Status: DC | PRN
  Administered 2019-10-04: 11:00:00 63 mL via OPHTHALMIC

## 2019-10-04 MED ORDER — FENTANYL CITRATE (PF) 100 MCG/2ML IJ SOLN
INTRAMUSCULAR | Status: DC | PRN
  Administered 2019-10-04: 50 ug via INTRAVENOUS

## 2019-10-04 MED ORDER — ACETAMINOPHEN 325 MG PO TABS: 325.0000 mg | ORAL_TABLET | Freq: Once | ORAL | Status: DC

## 2019-10-04 MED ORDER — MOXIFLOXACIN HCL 0.5 % OP SOLN
OPHTHALMIC | Status: DC | PRN
  Administered 2019-10-04: 0.2 mL via OPHTHALMIC

## 2019-10-04 MED ORDER — NA CHONDROIT SULF-NA HYALURON 40-17 MG/ML IO SOLN
INTRAOCULAR | Status: DC | PRN
  Administered 2019-10-04: 1 mL via INTRAOCULAR

## 2019-10-04 MED ORDER — LACTATED RINGERS IV SOLN: INTRAVENOUS | Status: DC

## 2019-10-04 MED ORDER — BRIMONIDINE TARTRATE-TIMOLOL 0.2-0.5 % OP SOLN
OPHTHALMIC | Status: DC | PRN
  Administered 2019-10-04: 1 [drp] via OPHTHALMIC

## 2019-10-04 MED ORDER — TETRACAINE HCL 0.5 % OP SOLN
1.0000 [drp] | OPHTHALMIC | Status: DC | PRN
  Administered 2019-10-04 (×3): 1 [drp] via OPHTHALMIC

## 2019-10-04 MED ORDER — LIDOCAINE HCL (PF) 2 % IJ SOLN
INTRAOCULAR | Status: DC | PRN
  Administered 2019-10-04: 1 mL

## 2019-10-04 MED ORDER — ACETAMINOPHEN 160 MG/5ML PO SOLN: 325.0000 mg | Freq: Once | ORAL | Status: DC

## 2019-10-04 SURGICAL SUPPLY — 20 items
CANNULA ANT/CHMB 27G (MISCELLANEOUS) ×2 IMPLANT
CANNULA ANT/CHMB 27GA (MISCELLANEOUS) ×6 IMPLANT
GLOVE SURG LX 8.0 MICRO (GLOVE) ×2
GLOVE SURG LX STRL 8.0 MICRO (GLOVE) ×1 IMPLANT
GLOVE SURG TRIUMPH 8.0 PF LTX (GLOVE) ×3 IMPLANT
GOWN STRL REUS W/ TWL LRG LVL3 (GOWN DISPOSABLE) ×2 IMPLANT
GOWN STRL REUS W/TWL LRG LVL3 (GOWN DISPOSABLE) ×4
LENS IOL TECNIS ITEC 21.5 (Intraocular Lens) ×2 IMPLANT
MARKER SKIN DUAL TIP RULER LAB (MISCELLANEOUS) ×3 IMPLANT
NDL FILTER BLUNT 18X1 1/2 (NEEDLE) ×1 IMPLANT
NDL RETROBULBAR .5 NSTRL (NEEDLE) ×3 IMPLANT
NEEDLE FILTER BLUNT 18X 1/2SAF (NEEDLE) ×2
NEEDLE FILTER BLUNT 18X1 1/2 (NEEDLE) ×1 IMPLANT
PACK EYE AFTER SURG (MISCELLANEOUS) ×3 IMPLANT
PACK OPTHALMIC (MISCELLANEOUS) ×3 IMPLANT
PACK PORFILIO (MISCELLANEOUS) ×3 IMPLANT
SYR 3ML LL SCALE MARK (SYRINGE) ×3 IMPLANT
SYR TB 1ML LUER SLIP (SYRINGE) ×3 IMPLANT
WATER STERILE IRR 250ML POUR (IV SOLUTION) ×3 IMPLANT
WIPE NON LINTING 3.25X3.25 (MISCELLANEOUS) ×3 IMPLANT

## 2019-10-04 NOTE — Anesthesia Postprocedure Evaluation (Signed)
Anesthesia Post Note  Patient: Jessica Reynolds  Procedure(s) Performed: CATARACT EXTRACTION PHACO AND INTRAOCULAR LENS PLACEMENT (IOC) LEFT  00:53.5  15.5%  8.31 (Left Eye)  Patient location during evaluation: PACU Anesthesia Type: MAC Level of consciousness: awake and alert and oriented Pain management: satisfactory to patient Vital Signs Assessment: post-procedure vital signs reviewed and stable Respiratory status: spontaneous breathing, nonlabored ventilation and respiratory function stable Cardiovascular status: blood pressure returned to baseline and stable Postop Assessment: Adequate PO intake and No signs of nausea or vomiting Anesthetic complications: no    Raliegh Ip

## 2019-10-04 NOTE — Anesthesia Procedure Notes (Signed)
Procedure Name: MAC Performed by: Gavinn Collard M, CRNA Pre-anesthesia Checklist: Timeout performed, Patient being monitored, Patient identified, Emergency Drugs available and Suction available Patient Re-evaluated:Patient Re-evaluated prior to induction Oxygen Delivery Method: Nasal cannula       

## 2019-10-04 NOTE — Anesthesia Preprocedure Evaluation (Signed)
Anesthesia Evaluation  Patient identified by MRN, date of birth, ID band Patient awake    Reviewed: Allergy & Precautions, H&P , NPO status , Patient's Chart, lab work & pertinent test results  Airway Mallampati: II  TM Distance: >3 FB Neck ROM: full    Dental no notable dental hx.    Pulmonary    Pulmonary exam normal breath sounds clear to auscultation       Cardiovascular hypertension, Normal cardiovascular exam Rhythm:regular Rate:Normal     Neuro/Psych    GI/Hepatic   Endo/Other    Renal/GU      Musculoskeletal   Abdominal   Peds  Hematology   Anesthesia Other Findings   Reproductive/Obstetrics                             Anesthesia Physical Anesthesia Plan  ASA: II  Anesthesia Plan: MAC   Post-op Pain Management:    Induction:   PONV Risk Score and Plan: 2 and Midazolam, TIVA and Treatment may vary due to age or medical condition  Airway Management Planned:   Additional Equipment:   Intra-op Plan:   Post-operative Plan:   Informed Consent: I have reviewed the patients History and Physical, chart, labs and discussed the procedure including the risks, benefits and alternatives for the proposed anesthesia with the patient or authorized representative who has indicated his/her understanding and acceptance.       Plan Discussed with: CRNA  Anesthesia Plan Comments:         Anesthesia Quick Evaluation

## 2019-10-04 NOTE — Discharge Instructions (Signed)

## 2019-10-04 NOTE — Transfer of Care (Signed)
Immediate Anesthesia Transfer of Care Note  Patient: Jessica Reynolds  Procedure(s) Performed: CATARACT EXTRACTION PHACO AND INTRAOCULAR LENS PLACEMENT (IOC) LEFT  00:53.5  15.5%  8.31 (Left Eye)  Patient Location: PACU  Anesthesia Type: MAC  Level of Consciousness: awake, alert  and patient cooperative  Airway and Oxygen Therapy: Patient Spontanous Breathing and Patient connected to supplemental oxygen  Post-op Assessment: Post-op Vital signs reviewed, Patient's Cardiovascular Status Stable, Respiratory Function Stable, Patent Airway and No signs of Nausea or vomiting  Post-op Vital Signs: Reviewed and stable  Complications: No apparent anesthesia complications

## 2019-10-04 NOTE — H&P (Signed)
All labs reviewed. Abnormal studies sent to patients PCP when indicated.  Previous H&P reviewed, patient examined, there are NO CHANGES.  Jessica Denard Porfilio10/20/202010:10 AM

## 2019-10-04 NOTE — Op Note (Signed)
PREOPERATIVE DIAGNOSIS:  Nuclear sclerotic cataract of the left eye.   POSTOPERATIVE DIAGNOSIS:  Nuclear sclerotic cataract of the left eye.   OPERATIVE PROCEDURE:@   SURGEON:  Birder Robson, MD.   ANESTHESIA:  Anesthesiologist: Ronelle Nigh, MD CRNA: Izetta Dakin, CRNA  1.      Managed anesthesia care. 2.     0.48ml of Shugarcaine was instilled following the paracentesis   COMPLICATIONS:  None.   TECHNIQUE:   Stop and chop   DESCRIPTION OF PROCEDURE:  The patient was examined and consented in the preoperative holding area where the aforementioned topical anesthesia was applied to the left eye and then brought back to the Operating Room where the left eye was prepped and draped in the usual sterile ophthalmic fashion and a lid speculum was placed. A paracentesis was created with the side port blade and the anterior chamber was filled with viscoelastic. A near clear corneal incision was performed with the steel keratome. A continuous curvilinear capsulorrhexis was performed with a cystotome followed by the capsulorrhexis forceps. Hydrodissection and hydrodelineation were carried out with BSS on a blunt cannula. The lens was removed in a stop and chop  technique and the remaining cortical material was removed with the irrigation-aspiration handpiece. The capsular bag was inflated with viscoelastic and the Technis [id number] lens was placed in the capsular bag without complication. The remaining viscoelastic was removed from the eye with the irrigation-aspiration handpiece. The wounds were hydrated. The anterior chamber was flushed with BSS and the eye was inflated to physiologic pressure. 0.67ml Vigamox was placed in the anterior chamber. The wounds were found to be water tight. The eye was dressed with Combigan. The patient was given protective glasses to wear throughout the day and a shield with which to sleep tonight. The patient was also given drops with which to begin a drop regimen today  and will follow-up with me in one day. Implant Name Type Inv. Item Serial No. Manufacturer Lot No. LRB No. Used Action  LENS IOL DIOP 21.5 - [id number] Intraocular Lens LENS IOL DIOP 21.5 [id number]  Left 1 Implanted    Procedure(s): CATARACT EXTRACTION PHACO AND INTRAOCULAR LENS PLACEMENT (IOC) LEFT  00:53.5  15.5%  8.31 (Left)  Electronically signed: Birder Robson 10/04/2019 10:38 AM

## 2019-10-05 ENCOUNTER — Encounter: Payer: Self-pay | Admitting: Ophthalmology

## 2019-10-12 ENCOUNTER — Other Ambulatory Visit: Payer: Self-pay

## 2019-10-12 ENCOUNTER — Ambulatory Visit (INDEPENDENT_AMBULATORY_CARE_PROVIDER_SITE_OTHER): Payer: PPO | Admitting: *Deleted

## 2019-10-12 ENCOUNTER — Telehealth: Payer: Self-pay | Admitting: Family Medicine

## 2019-10-12 DIAGNOSIS — R002 Palpitations: Secondary | ICD-10-CM

## 2019-10-12 DIAGNOSIS — Z23 Encounter for immunization: Secondary | ICD-10-CM

## 2019-10-12 NOTE — Telephone Encounter (Signed)
Patient was in office for Flu-shot and advised nurse, that PCP had referred her to cardiology last spring, patient stated that appointment was canceled several times and she never saw cardiology. Patient has up coming appointment with PCP  But would like a new referral to cardiology she would like to see Dr. Augustin Schooling, patient stated you were sending due to rapid pulse. Patient stated she has not had in a while but would still like to follow up with cardiology.

## 2019-10-13 NOTE — Telephone Encounter (Signed)
Patient notified referral in place.

## 2019-10-13 NOTE — Telephone Encounter (Signed)
Noted  Referral placed.

## 2019-11-07 ENCOUNTER — Ambulatory Visit: Payer: PPO | Admitting: Family Medicine

## 2019-11-07 DIAGNOSIS — H353131 Nonexudative age-related macular degeneration, bilateral, early dry stage: Secondary | ICD-10-CM | POA: Diagnosis not present

## 2019-11-07 DIAGNOSIS — Z961 Presence of intraocular lens: Secondary | ICD-10-CM | POA: Diagnosis not present

## 2019-11-08 DIAGNOSIS — R002 Palpitations: Secondary | ICD-10-CM | POA: Diagnosis not present

## 2019-11-08 DIAGNOSIS — Z7689 Persons encountering health services in other specified circumstances: Secondary | ICD-10-CM | POA: Diagnosis not present

## 2019-11-08 DIAGNOSIS — E7800 Pure hypercholesterolemia, unspecified: Secondary | ICD-10-CM | POA: Diagnosis not present

## 2019-11-08 DIAGNOSIS — I10 Essential (primary) hypertension: Secondary | ICD-10-CM | POA: Diagnosis not present

## 2019-11-08 DIAGNOSIS — R0602 Shortness of breath: Secondary | ICD-10-CM | POA: Diagnosis not present

## 2019-11-15 ENCOUNTER — Encounter: Payer: Self-pay | Admitting: Family Medicine

## 2019-11-15 ENCOUNTER — Other Ambulatory Visit: Payer: Self-pay

## 2019-11-15 ENCOUNTER — Ambulatory Visit (INDEPENDENT_AMBULATORY_CARE_PROVIDER_SITE_OTHER): Payer: PPO | Admitting: Family Medicine

## 2019-11-15 DIAGNOSIS — E785 Hyperlipidemia, unspecified: Secondary | ICD-10-CM

## 2019-11-15 DIAGNOSIS — I10 Essential (primary) hypertension: Secondary | ICD-10-CM

## 2019-11-15 DIAGNOSIS — R7303 Prediabetes: Secondary | ICD-10-CM

## 2019-11-15 NOTE — Assessment & Plan Note (Signed)
Patient has been on Crestor for quite some time.  She notes no history of stroke or heart attack.  She would like to come off of this as there seems to be some association with her palpitations improving with discontinuing the Crestor.  I think this is reasonable given her age and lack of stroke and heart attack history.  Discussed that palpitations are not a listed side effect with Crestor though given her improvement with stopping the Crestor it is possible that it is contributing.  Discussed that we may need to place her on an alternative cholesterol medicine in the future.  She will contact her cardiologist to advise them that she is stopping the Crestor and that her symptoms improved with discontinuing the Crestor.  Plan for lipid panel.

## 2019-11-15 NOTE — Assessment & Plan Note (Signed)
Well-controlled at cardiology.  She will continue her current regimen.

## 2019-11-15 NOTE — Assessment & Plan Note (Signed)
Patient has extensively altered her diet.  We will check an A1c.

## 2019-11-15 NOTE — Progress Notes (Signed)
Virtual Visit via telephone Note  This visit type was conducted due to national recommendations for restrictions regarding the COVID-19 pandemic (e.g. social distancing).  This format is felt to be most appropriate for this patient at this time.  All issues noted in this document were discussed and addressed.  No physical exam was performed (except for noted visual exam findings with Video Visits).   I connected with Jessica Reynolds today at  9:00 AM EST by telephone and verified that I am speaking with the correct person using two identifiers. Location patient: home Location provider: work Persons participating in the virtual visit: patient, provider  I discussed the limitations, risks, security and privacy concerns of performing an evaluation and management service by telephone and the availability of in person appointments. I also discussed with the patient that there may be a patient responsible charge related to this service. The patient expressed understanding and agreed to proceed.  Interactive audio and video telecommunications were attempted between this provider and patient, however failed, due to patient having technical difficulties OR patient did not have access to video capability.  We continued and completed visit with audio only.  Reason for visit: follow-up  HPI: Palpitations: Patient notes she has had significant palpitations recently.  She did see cardiology and she had an event monitor placed and is scheduled for a stress test and echo.  She notes Saturday she had lots of palpitations.  She stopped her Crestor on Sunday and notes palpitations improved significantly.  She noted she was off the Crestor for a day or so and had no significant palpitations so she took half a dose last night and the palpitations started back.  They have eased off today.  She wants to know if she can discontinue the Crestor.  Hypertension: Not checking blood pressures.  Taking metoprolol and Maxide.   No chest pain.  She did have some mild shortness of breath with palpitations though she wonders if that was anxiety related.  She notes no change to her chronic edema related to a toe issue.  Prediabetes: No polyuria or polydipsia.  She is down about 39 pounds purposefully.  She has been avoiding white foods.  She is exercising with tennis and walking.   ROS: See pertinent positives and negatives per HPI.  Past Medical History:  Diagnosis Date  . Arrhythmia   . Arthritis    shoulder  . Colon polyp   . Headache    migraines  . HOH (hard of hearing)    wears aids  . Hypercholesterolemia   . Hypertension   . Osteoarthritis   . UTI (lower urinary tract infection)     Past Surgical History:  Procedure Laterality Date  . ABDOMINAL HYSTERECTOMY  1980   BSO, fibroids  . ACHILLES TENDON SURGERY Right 1990  . ACHILLES TENDON SURGERY Left 1997  . CATARACT EXTRACTION W/PHACO Right 09/13/2019   Procedure: CATARACT EXTRACTION PHACO AND INTRAOCULAR LENS PLACEMENT (IOC) RIGHT 01:01.4           11 .2%           6.93;  Surgeon: Birder Robson, MD;  Location: [facility];  Service: Ophthalmology;  Laterality: Right;  . CATARACT EXTRACTION W/PHACO Left 10/04/2019   Procedure: CATARACT EXTRACTION PHACO AND INTRAOCULAR LENS PLACEMENT (IOC) LEFT  00:53.5  15.5%  8.31;  Surgeon: Birder Robson, MD;  Location: [facility];  Service: Ophthalmology;  Laterality: Left;  . [place]  . VAGINAL DELIVERY  4    Family History  Problem Relation Age of Onset  . Stroke Mother   . Heart disease Father   . Macular degeneration Sister   . Diabetes Sister   . Breast cancer Neg Hx     SOCIAL HX: Non-smoker   Current Outpatient Medications:  .  aspirin EC 81 MG tablet, Take 81 mg by mouth daily., Disp: , Rfl:  .  calcium carbonate (OS-CAL - DOSED IN MG OF ELEMENTAL CALCIUM) 1250 (500 Ca) MG tablet, Take 1 tablet by mouth., Disp: , Rfl:  .  Cholecalciferol  (VITAMIN D3 PO), Take 125 mg by mouth daily., Disp: , Rfl:  .  Cholecalciferol (VITAMIN D3) 10 MCG (400 UNIT) CAPS, Take by mouth., Disp: , Rfl:  .  fluticasone (FLONASE) 50 MCG/ACT nasal spray, USE TWO SPRAY(S) IN EACH NOSTRIL ONCE DAILY, Disp: 16 g, Rfl: 6 .  ibuprofen (ADVIL) 200 MG tablet, Take 200 mg by mouth every 6 (six) hours as needed., Disp: , Rfl:  .  metoprolol succinate (TOPROL-XL) 50 MG 24 hr tablet, Take 1 tablet by mouth once daily, Disp: 90 tablet, Rfl: 1 .  Multiple Vitamins-Minerals (PRESERVISION/LUTEIN) CAPS, Take 2 capsules by mouth daily., Disp: , Rfl:  .  rosuvastatin (CRESTOR) 10 MG tablet, TAKE ONE TABLETS BY MOUTH THREE TIMES WEEKLY, Disp: 36 tablet, Rfl: 1 .  triamterene-hydrochlorothiazide (MAXZIDE-25) 37.5-25 MG tablet, TAKE 1/2 (ONE-HALF) TABLET BY MOUTH IN THE MORNING, Disp: 45 tablet, Rfl: 1  EXAM: This is a telehealth telephone visit and thus no physical exam was completed.  ASSESSMENT AND PLAN:  Discussed the following assessment and plan:  No problem-specific Assessment & Plan notes found for this encounter.    I discussed the assessment and treatment plan with the patient. The patient was provided an opportunity to ask questions and all were answered. The patient agreed with the plan and demonstrated an understanding of the instructions.   The patient was advised to call back or seek an in-person evaluation if the symptoms worsen or if the condition fails to improve as anticipated.  I provided 12 minutes of non-face-to-face time during this encounter.   Tommi Rumps, MD

## 2019-11-24 ENCOUNTER — Other Ambulatory Visit: Payer: Self-pay

## 2019-11-24 ENCOUNTER — Other Ambulatory Visit (INDEPENDENT_AMBULATORY_CARE_PROVIDER_SITE_OTHER): Payer: PPO

## 2019-11-24 DIAGNOSIS — E785 Hyperlipidemia, unspecified: Secondary | ICD-10-CM

## 2019-11-24 DIAGNOSIS — R7303 Prediabetes: Secondary | ICD-10-CM

## 2019-11-24 LAB — COMPREHENSIVE METABOLIC PANEL
ALT: 13 U/L (ref 0–35)
AST: 20 U/L (ref 0–37)
Albumin: 4.2 g/dL (ref 3.5–5.2)
Alkaline Phosphatase: 41 U/L (ref 39–117)
BUN: 16 mg/dL (ref 6–23)
CO2: 31 mEq/L (ref 19–32)
Calcium: 9.5 mg/dL (ref 8.4–10.5)
Chloride: 99 mEq/L (ref 96–112)
Creatinine, Ser: 0.88 mg/dL (ref 0.40–1.20)
GFR: 60.43 mL/min (ref >60.00)
Glucose, Bld: 93 mg/dL (ref 70–99)
Potassium: 3.8 mEq/L (ref 3.5–5.1)
Sodium: 137 mEq/L (ref 135–145)
Total Bilirubin: 0.5 mg/dL (ref 0.2–1.2)
Total Protein: 6.1 g/dL (ref 6.0–8.3)

## 2019-11-24 LAB — LIPID PANEL
Cholesterol: 192 mg/dL (ref 0–200)
HDL: 58.7 mg/dL (ref >39.00)
LDL Cholesterol: 115 mg/dL — ABNORMAL HIGH (ref 0–99)
NonHDL: 133.7
Total CHOL/HDL Ratio: 3
Triglycerides: 96 mg/dL (ref 0.0–149.0)
VLDL: 19.2 mg/dL (ref 0.0–40.0)

## 2019-11-24 LAB — HEMOGLOBIN A1C: Hgb A1c MFr Bld: 5.8 % (ref 4.6–6.5)

## 2019-12-26 DIAGNOSIS — R002 Palpitations: Secondary | ICD-10-CM | POA: Diagnosis not present

## 2019-12-26 DIAGNOSIS — R0602 Shortness of breath: Secondary | ICD-10-CM | POA: Diagnosis not present

## 2019-12-31 ENCOUNTER — Other Ambulatory Visit: Payer: Self-pay | Admitting: Internal Medicine

## 2020-01-03 DIAGNOSIS — E7800 Pure hypercholesterolemia, unspecified: Secondary | ICD-10-CM | POA: Diagnosis not present

## 2020-01-03 DIAGNOSIS — R001 Bradycardia, unspecified: Secondary | ICD-10-CM | POA: Diagnosis not present

## 2020-01-03 DIAGNOSIS — I10 Essential (primary) hypertension: Secondary | ICD-10-CM | POA: Diagnosis not present

## 2020-01-03 DIAGNOSIS — I471 Supraventricular tachycardia, unspecified: Secondary | ICD-10-CM | POA: Insufficient documentation

## 2020-01-03 DIAGNOSIS — R0602 Shortness of breath: Secondary | ICD-10-CM | POA: Diagnosis not present

## 2020-02-13 DIAGNOSIS — M2011 Hallux valgus (acquired), right foot: Secondary | ICD-10-CM | POA: Diagnosis not present

## 2020-02-13 DIAGNOSIS — M2041 Other hammer toe(s) (acquired), right foot: Secondary | ICD-10-CM | POA: Diagnosis not present

## 2020-02-13 DIAGNOSIS — M2012 Hallux valgus (acquired), left foot: Secondary | ICD-10-CM | POA: Diagnosis not present

## 2020-02-14 ENCOUNTER — Ambulatory Visit: Payer: PPO | Admitting: Family Medicine

## 2020-02-24 ENCOUNTER — Other Ambulatory Visit: Payer: Self-pay | Admitting: Internal Medicine

## 2020-03-14 ENCOUNTER — Other Ambulatory Visit: Payer: Self-pay

## 2020-03-19 ENCOUNTER — Encounter: Payer: Self-pay | Admitting: Family Medicine

## 2020-03-19 ENCOUNTER — Ambulatory Visit (INDEPENDENT_AMBULATORY_CARE_PROVIDER_SITE_OTHER): Payer: PPO | Admitting: Family Medicine

## 2020-03-19 ENCOUNTER — Other Ambulatory Visit: Payer: Self-pay

## 2020-03-19 DIAGNOSIS — M2041 Other hammer toe(s) (acquired), right foot: Secondary | ICD-10-CM | POA: Diagnosis not present

## 2020-03-19 DIAGNOSIS — R002 Palpitations: Secondary | ICD-10-CM

## 2020-03-19 DIAGNOSIS — I10 Essential (primary) hypertension: Secondary | ICD-10-CM

## 2020-03-19 NOTE — Assessment & Plan Note (Signed)
Well-controlled.  Continue current regimen. 

## 2020-03-19 NOTE — Patient Instructions (Signed)
Nice to see you. Please continue current medications.

## 2020-03-19 NOTE — Assessment & Plan Note (Signed)
She will continue with podiatry.

## 2020-03-19 NOTE — Assessment & Plan Note (Signed)
She has seen cardiology.  Found to have PACs, PVCs, and paroxysmal SVT.  Metoprolol has been beneficial.  She will continue this.  She will monitor for any fatigue related to bradycardia.  She will also monitor for persistent palpitations and if those occur she will let us or cardiology know.

## 2020-03-19 NOTE — Progress Notes (Signed)
  Tommi Rumps, MD Phone: [phone]  Jessica Reynolds is a 86 y.o. female who presents today for f/u.  HYPERTENSION  Disease Monitoring  Home BP Monitoring not checking Chest pain- no    Dyspnea- no Medications  Compliance-  Taking maxzide, metoprolol.  Edema- no  Hyperlipidemia: She stopped Crestor relating to palpitations.  She does not want to go back on it.  Hammertoe: She is going to have a surgery for this in August.  Palpitations: Chronic issue.  She is followed by cardiology for this.  She is on metoprolol and that has helped with her symptoms.    Social History   Tobacco Use  Smoking Status Never Smoker  Smokeless Tobacco Never Used     ROS see history of present illness  Objective  Physical Exam Vitals:   03/19/20 1542  BP: 120/70  Pulse: (!) 54  Temp: (!) 95.9 F (35.5 C)  SpO2: 97%    BP Readings from Last 3 Encounters:  03/19/20 120/70  10/04/19 105/64  09/13/19 110/69   Wt Readings from Last 3 Encounters:  03/19/20 137 lb 3.2 oz (62.2 kg)  11/15/19 135 lb (61.2 kg)  10/04/19 134 lb (60.8 kg)    Physical Exam Constitutional:      General: She is not in acute distress.    Appearance: She is not diaphoretic.  Cardiovascular:     Rate and Rhythm: Normal rate and regular rhythm.     Heart sounds: Normal heart sounds.  Pulmonary:     Effort: Pulmonary effort is normal.     Breath sounds: Normal breath sounds.  Musculoskeletal:     Right lower leg: No edema.     Left lower leg: No edema.  Skin:    General: Skin is warm and dry.  Neurological:     Mental Status: She is alert.      Assessment/Plan: Please see individual problem list.  Palpitations She has seen cardiology.  Found to have PACs, PVCs, and paroxysmal SVT.  Metoprolol has been beneficial.  She will continue this.  She will monitor for any fatigue related to bradycardia.  She will also monitor for persistent palpitations and if those occur she will let us or cardiology  know.  Benign essential HTN Well-controlled.  Continue current regimen.  Hammertoe of right foot She will continue with podiatry.   No orders of the defined types were placed in this encounter.   No orders of the defined types were placed in this encounter.   This visit occurred during the SARS-CoV-2 public health emergency.  Safety protocols were in place, including screening questions prior to the visit, additional usage of staff PPE, and extensive cleaning of exam room while observing appropriate contact time as indicated for disinfecting solutions.    Tommi Rumps, MD [place]

## 2020-04-04 ENCOUNTER — Other Ambulatory Visit: Payer: Self-pay

## 2020-04-24 ENCOUNTER — Other Ambulatory Visit: Payer: Self-pay | Admitting: Family Medicine

## 2020-05-07 DIAGNOSIS — H353131 Nonexudative age-related macular degeneration, bilateral, early dry stage: Secondary | ICD-10-CM | POA: Diagnosis not present

## 2020-06-20 DIAGNOSIS — H353131 Nonexudative age-related macular degeneration, bilateral, early dry stage: Secondary | ICD-10-CM | POA: Diagnosis not present

## 2020-07-09 ENCOUNTER — Ambulatory Visit (INDEPENDENT_AMBULATORY_CARE_PROVIDER_SITE_OTHER): Payer: PPO

## 2020-07-09 VITALS — Ht 64.0 in | Wt 137.0 lb

## 2020-07-09 DIAGNOSIS — Z0000 Encounter for general adult medical examination without abnormal findings: Secondary | ICD-10-CM | POA: Diagnosis not present

## 2020-07-09 DIAGNOSIS — R002 Palpitations: Secondary | ICD-10-CM | POA: Diagnosis not present

## 2020-07-09 DIAGNOSIS — I471 Supraventricular tachycardia: Secondary | ICD-10-CM | POA: Diagnosis not present

## 2020-07-09 DIAGNOSIS — E7800 Pure hypercholesterolemia, unspecified: Secondary | ICD-10-CM | POA: Diagnosis not present

## 2020-07-09 DIAGNOSIS — I10 Essential (primary) hypertension: Secondary | ICD-10-CM | POA: Diagnosis not present

## 2020-07-09 NOTE — Patient Instructions (Addendum)
Jessica Reynolds , Thank you for taking time to come for your Medicare Wellness Visit. I appreciate your ongoing commitment to your health goals. Please review the following plan we discussed and let me know if I can assist you in the future.   These are the goals we discussed: Goals      Patient Stated   .  Follow up with Primary Care Provider (pt-stated)      Follow up as needed Wear hearing aids more often for better quality of life       This is a list of the screening recommended for you and due dates:  Health Maintenance  Topic Date Due  . Flu Shot  07/15/2020  . Tetanus Vaccine  01/18/2023  . DEXA scan (bone density measurement)  Completed  . COVID-19 Vaccine  Completed  . Pneumonia vaccines  Completed    Immunizations Immunization History  Administered Date(s) Administered  . Fluad Quad(high Dose 65+) 10/12/2019  . Influenza Split 09/14/2014, 10/11/2014  . Influenza, High Dose Seasonal PF 11/16/2017, 11/05/2018  . Influenza,inj,Quad PF,6+ Mos 10/24/2015  . Influenza-Unspecified 12/22/2016  . PFIZER SARS-COV-2 Vaccination 12/29/2019, 01/19/2020  . Pneumococcal Conjugate-13 01/28/2016  . Pneumococcal Polysaccharide-23 12/15/1998, 03/12/2011  . Td 01/18/2013  . Zoster 07/05/2007, 01/22/2009   Keep all routine maintenance appointments.   Next scheduled lab  Follow up 09/24/20 @ 10:00  Advanced directives: End of life planning; Advance aging; Advanced directives discussed.  Copy of current HCPOA/Living Will requested.    Conditions/risks identified: none new  Follow up in one year for your annual wellness visit    Preventive Care 65 Years and Older, Female Preventive care refers to lifestyle choices and visits with your health care provider that can promote health and wellness. What does preventive care include?  A yearly physical exam. This is also called an annual well check.  Dental exams once or twice a year.  Routine eye exams. Ask your health care  provider how often you should have your eyes checked.  Personal lifestyle choices, including:  Daily care of your teeth and gums.  Regular physical activity.  Eating a healthy diet.  Avoiding tobacco and drug use.  Limiting alcohol use.  Practicing safe sex.  Taking low-dose aspirin every day.  Taking vitamin and mineral supplements as recommended by your health care provider. What happens during an annual well check? The services and screenings done by your health care provider during your annual well check will depend on your age, overall health, lifestyle risk factors, and family history of disease. Counseling  Your health care provider may ask you questions about your:  Alcohol use.  Tobacco use.  Drug use.  Emotional well-being.  Home and relationship well-being.  Sexual activity.  Eating habits.  History of falls.  Memory and ability to understand (cognition).  Work and work Statistician.  Reproductive health. Screening  You may have the following tests or measurements:  Height, weight, and BMI.  Blood pressure.  Lipid and cholesterol levels. These may be checked every 5 years, or more frequently if you are over 78 years old.  Skin check.  Lung cancer screening. You may have this screening every year starting at age 71 if you have a 30-pack-year history of smoking and currently smoke or have quit within the past 15 years.  Fecal occult blood test (FOBT) of the stool. You may have this test every year starting at age 42.  Flexible sigmoidoscopy or colonoscopy. You may have a sigmoidoscopy every 5  years or a colonoscopy every 10 years starting at age 16.  Hepatitis C blood test.  Hepatitis B blood test.  Sexually transmitted disease (STD) testing.  Diabetes screening. This is done by checking your blood sugar (glucose) after you have not eaten for a while (fasting). You may have this done every 1-3 years.  Bone density scan. This is done to  screen for osteoporosis. You may have this done starting at age 105.  Mammogram. This may be done every 1-2 years. Talk to your health care provider about how often you should have regular mammograms. Talk with your health care provider about your test results, treatment options, and if necessary, the need for more tests. Vaccines  Your health care provider may recommend certain vaccines, such as:  Influenza vaccine. This is recommended every year.  Tetanus, diphtheria, and acellular pertussis (Tdap, Td) vaccine. You may need a Td booster every 10 years.  Zoster vaccine. You may need this after age 24.  Pneumococcal 13-valent conjugate (PCV13) vaccine. One dose is recommended after age 4.  Pneumococcal polysaccharide (PPSV23) vaccine. One dose is recommended after age 74. Talk to your health care provider about which screenings and vaccines you need and how often you need them. This information is not intended to replace advice given to you by your health care provider. Make sure you discuss any questions you have with your health care provider. Document Released: 12/28/2015 Document Revised: 08/20/2016 Document Reviewed: 10/02/2015 Elsevier Interactive Patient Education  2017 [place] Prevention in the Home Falls can cause injuries. They can happen to people of all ages. There are many things you can do to make your home safe and to help prevent falls. What can I do on the outside of my home?  Regularly fix the edges of walkways and driveways and fix any cracks.  Remove anything that might make you trip as you walk through a door, such as a raised step or threshold.  Trim any bushes or trees on the path to your home.  Use bright outdoor lighting.  Clear any walking paths of anything that might make someone trip, such as rocks or tools.  Regularly check to see if handrails are loose or broken. Make sure that both sides of any steps have handrails.  Any raised decks  and porches should have guardrails on the edges.  Have any leaves, snow, or ice cleared regularly.  Use sand or salt on walking paths during winter.  Clean up any spills in your garage right away. This includes oil or grease spills. What can I do in the bathroom?  Use night lights.  Install grab bars by the toilet and in the tub and shower. Do not use towel bars as grab bars.  Use non-skid mats or decals in the tub or shower.  If you need to sit down in the shower, use a plastic, non-slip stool.  Keep the floor dry. Clean up any water that spills on the floor as soon as it happens.  Remove soap buildup in the tub or shower regularly.  Attach bath mats securely with double-sided non-slip rug tape.  Do not have throw rugs and other things on the floor that can make you trip. What can I do in the bedroom?  Use night lights.  Make sure that you have a light by your bed that is easy to reach.  Do not use any sheets or blankets that are too big for your bed. They should not hang  down onto the floor.  Have a firm chair that has side arms. You can use this for support while you get dressed.  Do not have throw rugs and other things on the floor that can make you trip. What can I do in the kitchen?  Clean up any spills right away.  Avoid walking on wet floors.  Keep items that you use a lot in easy-to-reach places.  If you need to reach something above you, use a strong step stool that has a grab bar.  Keep electrical cords out of the way.  Do not use floor polish or wax that makes floors slippery. If you must use wax, use non-skid floor wax.  Do not have throw rugs and other things on the floor that can make you trip. What can I do with my stairs?  Do not leave any items on the stairs.  Make sure that there are handrails on both sides of the stairs and use them. Fix handrails that are broken or loose. Make sure that handrails are as long as the stairways.  Check any  carpeting to make sure that it is firmly attached to the stairs. Fix any carpet that is loose or worn.  Avoid having throw rugs at the top or bottom of the stairs. If you do have throw rugs, attach them to the floor with carpet tape.  Make sure that you have a light switch at the top of the stairs and the bottom of the stairs. If you do not have them, ask someone to add them for you. What else can I do to help prevent falls?  Wear shoes that:  Do not have high heels.  Have rubber bottoms.  Are comfortable and fit you well.  Are closed at the toe. Do not wear sandals.  If you use a stepladder:  Make sure that it is fully opened. Do not climb a closed stepladder.  Make sure that both sides of the stepladder are locked into place.  Ask someone to hold it for you, if possible.  Clearly mark and make sure that you can see:  Any grab bars or handrails.  First and last steps.  Where the edge of each step is.  Use tools that help you move around (mobility aids) if they are needed. These include:  Canes.  Walkers.  Scooters.  Crutches.  Turn on the lights when you go into a dark area. Replace any light bulbs as soon as they burn out.  Set up your furniture so you have a clear path. Avoid moving your furniture around.  If any of your floors are uneven, fix them.  If there are any pets around you, be aware of where they are.  Review your medicines with your doctor. Some medicines can make you feel dizzy. This can increase your chance of falling. Ask your doctor what other things that you can do to help prevent falls. This information is not intended to replace advice given to you by your health care provider. Make sure you discuss any questions you have with your health care provider. Document Released: 09/27/2009 Document Revised: 05/08/2016 Document Reviewed: 01/05/2015 Elsevier Interactive Patient Education  2017 [employer].

## 2020-07-09 NOTE — Progress Notes (Signed)
Subjective:   JOANNY DUPREE is a 86 y.o. female who presents for Medicare Annual (Subsequent) preventive examination.  Review of Systems    No ROS.  Medicare Wellness Virtual Visit.   Cardiac Risk Factors include: advanced age (>70men, >75 women)     Objective:    Today's Vitals   07/09/20 1236  Weight: 137 lb (62.1 kg)  Height: 5\' 4"  (1.626 m)   Body mass index is 23.52 kg/m.  Advanced Directives 07/09/2020 10/04/2019 07/07/2019 07/05/2018 01/09/2016  Does Patient Have a Medical Advance Directive? Yes Yes Yes Yes No;Yes  Type of Paramedic of [place];Living will [place];Living will [place];Living will [place] will  Does patient want to make changes to medical advance directive? No - Patient declined No - Patient declined No - Patient declined No - Patient declined -  Copy of [place] in Chart? No - copy requested No - copy requested No - copy requested No - copy requested -    Current Medications (verified) Outpatient Encounter Medications as of 07/09/2020  Medication Sig  . aspirin EC 81 MG tablet Take 81 mg by mouth daily.  . calcium carbonate (OS-CAL - DOSED IN MG OF ELEMENTAL CALCIUM) 1250 (500 Ca) MG tablet Take 1 tablet by mouth.  . Cholecalciferol (VITAMIN D3 PO) Take 125 mg by mouth daily.  . Cholecalciferol (VITAMIN D3) 10 MCG (400 UNIT) CAPS Take by mouth.  . fluticasone (FLONASE) 50 MCG/ACT nasal spray USE TWO SPRAY(S) IN EACH NOSTRIL ONCE DAILY  . ibuprofen (ADVIL) 200 MG tablet Take 200 mg by mouth every 6 (six) hours as needed.  . metoprolol succinate (TOPROL-XL) 50 MG 24 hr tablet Take 1 tablet by mouth once daily  . Multiple Vitamins-Minerals (PRESERVISION/LUTEIN) CAPS Take 2 capsules by mouth daily.  Marland Kitchen triamterene-hydrochlorothiazide (MAXZIDE-25) 37.5-25 MG tablet TAKE 1/2 (ONE-HALF) TABLET BY MOUTH IN THE MORNING   No facility-administered  encounter medications on file as of 07/09/2020.    Allergies (verified) Celecoxib   History: Past Medical History:  Diagnosis Date  . Arrhythmia   . Arthritis    shoulder  . Colon polyp   . Headache    migraines  . HOH (hard of hearing)    wears aids  . Hypercholesterolemia   . Hypertension   . Osteoarthritis   . UTI (lower urinary tract infection)    Past Surgical History:  Procedure Laterality Date  . ABDOMINAL HYSTERECTOMY  1980   BSO, fibroids  . ACHILLES TENDON SURGERY Right 1990  . ACHILLES TENDON SURGERY Left 1997  . CATARACT EXTRACTION W/PHACO Right 09/13/2019   Procedure: CATARACT EXTRACTION PHACO AND INTRAOCULAR LENS PLACEMENT (IOC) RIGHT 01:01.4           11.2%           6.93;  Surgeon: Birder Robson, MD;  Location: [facility];  Service: Ophthalmology;  Laterality: Right;  . CATARACT EXTRACTION W/PHACO Left 10/04/2019   Procedure: CATARACT EXTRACTION PHACO AND INTRAOCULAR LENS PLACEMENT (IOC) LEFT  00:53.5  15.5%  8.31;  Surgeon: Birder Robson, MD;  Location: [facility];  Service: Ophthalmology;  Laterality: Left;  . [place]  . VAGINAL DELIVERY     4   Family History  Problem Relation Age of Onset  . Stroke Mother   . Heart disease Father   . Macular degeneration Sister   . Diabetes Sister   . Breast cancer Neg  Hx    Social History   Socioeconomic History  . Marital status: Widowed    Spouse name: Not on file  . Number of children: Not on file  . Years of education: Not on file  . Highest education level: Not on file  Occupational History  . Not on file  Tobacco Use  . Smoking status: Never Smoker  . Smokeless tobacco: Never Used  Vaping Use  . Vaping Use: Never used  Substance and Sexual Activity  . Alcohol use: Yes    Comment: social  . Drug use: No  . Sexual activity: Not on file  Other Topics Concern  . Not on file  Social History Narrative   Born in [place], [place].      Lives in  [place] with son. Husband passed away Mar 18, 2012. No pets.      Work - retired for [employer] - regular      Exercise - tennis,  3-5 days per week   Social Determinants of Radio broadcast assistant Strain: Low Risk   . Difficulty of Paying Living Expenses: Not hard at all  Food Insecurity:   . Worried About Charity fundraiser in the Last Year:   . Arboriculturist in the Last Year:   Transportation Needs: No Transportation Needs  . Lack of Transportation (Medical): No  . Lack of Transportation (Non-Medical): No  Physical Activity:   . Days of Exercise per Week:   . Minutes of Exercise per Session:   Stress: No Stress Concern Present  . Feeling of Stress : Not at all  Social Connections: Unknown  . Frequency of Communication with Friends and Family: More than three times a week  . Frequency of Social Gatherings with Friends and Family: Not on file  . Attends Religious Services: Not on file  . Active Member of Clubs or Organizations: Not on file  . Attends Archivist Meetings: Not on file  . Marital Status: Not on file    Tobacco Counseling Counseling given: Not Answered   Clinical Intake:  Pre-visit preparation completed: Yes       Diabetes: No  How often do you need to have someone help you when you read instructions, pamphlets, or other written materials from your doctor or pharmacy?: 1 - Never   Interpreter Needed?: No      Activities of Daily Living In your present state of health, do you have any difficulty performing the following activities: 07/09/2020 10/04/2019  Hearing? Y N  Comment Hearing aids -  Vision? N N  Difficulty concentrating or making decisions? N N  Walking or climbing stairs? N N  Dressing or bathing? N N  Doing errands, shopping? N -  Preparing Food and eating ? N -  Using the Toilet? N -  In the past six months, have you accidently leaked urine? N -  Comment Managed with daily liner -  Do you  have problems with loss of bowel control? N -  Managing your Medications? N -  Managing your Finances? N -  Housekeeping or managing your Housekeeping? N -  Some recent data might be hidden    Patient Care Team: Leone Haven, MD as PCP - General (Family Medicine) Jackolyn Confer, MD (Internal Medicine)  Indicate any recent Medical Services you may have received from other than [facility] providers in the past year (date may be approximate).     Assessment:  This is a routine wellness examination for [place].  I connected with Kacia today by telephone and verified that I am speaking with the correct person using two identifiers. Location patient: home Location provider: work Persons participating in the virtual visit: patient, Marine scientist.    I discussed the limitations, risks, security and privacy concerns of performing an evaluation and management service by telephone and the availability of in person appointments. The patient expressed understanding and verbally consented to this telephonic visit.    Interactive audio and video telecommunications were attempted between this provider and patient, however failed, due to patient having technical difficulties OR patient did not have access to video capability.  We continued and completed visit with audio only.  Some vital signs may be absent or patient reported.   Hearing/Vision screen  Hearing Screening   125Hz  250Hz  500Hz  1000Hz  2000Hz  3000Hz  4000Hz  6000Hz  8000Hz   Right ear:           Left ear:           Comments: Hearing aid, bilateral   Vision Screening Comments: Wears reader lenses Cataract extraction, bilateral Visual acuity not assessed, virtual visit.  They have seen their ophthalmologist in the last 12 months.    Dietary issues and exercise activities discussed: Current Exercise Habits: Home exercise routine, Type of exercise: walking, Time (Minutes): 20, Frequency (Times/Week): 4, Weekly Exercise (Minutes/Week): 80,  Intensity: Mild  Goals      Patient Stated   .  Follow up with Primary Care Provider (pt-stated)      Follow up as needed Wear hearing aids more often for better quality of life      Depression Screen PHQ 2/9 Scores 07/09/2020 11/15/2019 07/06/2019 05/31/2019 01/10/2019 07/05/2018 06/09/2017  PHQ - 2 Score 0 0 0 0 0 0 0  PHQ- 9 Score - - - 0 - - 0    Fall Risk Fall Risk  07/09/2020 11/15/2019 07/06/2019 05/31/2019 01/10/2019  Falls in the past year? 0 0 0 0 0  Number falls in past yr: 0 0 0 0 0  Injury with Fall? 0 - - 0 0  Follow up Falls evaluation completed Falls evaluation completed - Falls evaluation completed -   Handrails in use when climbing stairs? Yes  Home free of loose throw rugs in walkways, pet beds, electrical cords, etc? Yes  Adequate lighting in your home to reduce risk of falls? Yes   ASSISTIVE DEVICES UTILIZED TO PREVENT FALLS:  Life alert? No  Use of a cane, walker or w/c? No  Grab bars in the bathroom? Yes  Shower chair or bench in shower? No  Elevated toilet seat or a handicapped toilet? No   TIMED UP AND GO:  Was the test performed? No .   Cognitive Function:  Patient is alert and oriented x3.  Manages her own finances and home.    6CIT Screen 07/09/2020 07/07/2019 07/05/2018  What Year? 0 points 0 points 0 points  What month? 0 points 0 points 0 points  What time? - 0 points 0 points  Count back from 20 - 0 points 0 points  Months in reverse 0 points 0 points 0 points  Repeat phrase 4 points 0 points 0 points  Total Score - 0 0    Immunizations Immunization History  Administered Date(s) Administered  . Fluad Quad(high Dose 65+) 10/12/2019  . Influenza Split 09/14/2014, 10/11/2014  . Influenza, High Dose Seasonal PF 11/16/2017, 11/05/2018  . Influenza,inj,Quad PF,6+ Mos 10/24/2015  . Influenza-Unspecified 12/22/2016  .  PFIZER SARS-COV-2 Vaccination 12/29/2019, 01/19/2020  . Pneumococcal Conjugate-13 01/28/2016  . Pneumococcal Polysaccharide-23  12/15/1998, 03/12/2011  . Td 01/18/2013  . Zoster 07/05/2007, 01/22/2009   Health Maintenance There are no preventive care reminders to display for this patient.  Health Maintenance  Topic Date Due  . INFLUENZA VACCINE  07/15/2020  . TETANUS/TDAP  01/18/2023  . DEXA SCAN  Completed  . COVID-19 Vaccine  Completed  . PNA vac Low Risk Adult  Completed    Dental Screening: Recommended annual dental exams for proper oral hygiene. Visits every 6 months.   Community Resource Referral / Chronic Care Management: CRR required this visit?  No   CCM required this visit?  No      Plan:   Keep all routine maintenance appointments.   Next scheduled lab  Follow up 09/24/20 @ 10:00  I have personally reviewed and noted the following in the patient's chart:   . Medical and social history . Use of alcohol, tobacco or illicit drugs  . Current medications and supplements . Functional ability and status . Nutritional status . Physical activity . Advanced directives . List of other physicians . Hospitalizations, surgeries, and ER visits in previous 12 months . Vitals . Screenings to include cognitive, depression, and falls . Referrals and appointments  In addition, I have reviewed and discussed with patient certain preventive protocols, quality metrics, and best practice recommendations. A written personalized care plan for preventive services as well as general preventive health recommendations were provided to patient vis mychart.     Varney Biles, LPN   [id number]

## 2020-07-10 ENCOUNTER — Other Ambulatory Visit: Payer: Self-pay

## 2020-07-13 ENCOUNTER — Other Ambulatory Visit: Payer: Self-pay | Admitting: Family Medicine

## 2020-07-30 DIAGNOSIS — R7303 Prediabetes: Secondary | ICD-10-CM | POA: Diagnosis not present

## 2020-07-30 DIAGNOSIS — M8949 Other hypertrophic osteoarthropathy, multiple sites: Secondary | ICD-10-CM | POA: Diagnosis not present

## 2020-07-30 DIAGNOSIS — R002 Palpitations: Secondary | ICD-10-CM | POA: Diagnosis not present

## 2020-07-30 DIAGNOSIS — I471 Supraventricular tachycardia: Secondary | ICD-10-CM | POA: Diagnosis not present

## 2020-07-30 DIAGNOSIS — M2011 Hallux valgus (acquired), right foot: Secondary | ICD-10-CM | POA: Diagnosis not present

## 2020-07-30 DIAGNOSIS — M2041 Other hammer toe(s) (acquired), right foot: Secondary | ICD-10-CM | POA: Diagnosis not present

## 2020-07-30 DIAGNOSIS — M2012 Hallux valgus (acquired), left foot: Secondary | ICD-10-CM | POA: Diagnosis not present

## 2020-07-30 DIAGNOSIS — I10 Essential (primary) hypertension: Secondary | ICD-10-CM | POA: Diagnosis not present

## 2020-07-30 DIAGNOSIS — Z01818 Encounter for other preprocedural examination: Secondary | ICD-10-CM | POA: Diagnosis not present

## 2020-08-12 DIAGNOSIS — M2011 Hallux valgus (acquired), right foot: Secondary | ICD-10-CM | POA: Diagnosis not present

## 2020-08-12 DIAGNOSIS — Z20822 Contact with and (suspected) exposure to covid-19: Secondary | ICD-10-CM | POA: Diagnosis not present

## 2020-08-14 DIAGNOSIS — Z8759 Personal history of other complications of pregnancy, childbirth and the puerperium: Secondary | ICD-10-CM | POA: Diagnosis not present

## 2020-08-14 DIAGNOSIS — M2011 Hallux valgus (acquired), right foot: Secondary | ICD-10-CM | POA: Diagnosis not present

## 2020-08-14 DIAGNOSIS — Z86010 Personal history of colonic polyps: Secondary | ICD-10-CM | POA: Diagnosis not present

## 2020-08-14 DIAGNOSIS — Z7982 Long term (current) use of aspirin: Secondary | ICD-10-CM | POA: Diagnosis not present

## 2020-08-14 DIAGNOSIS — Z79899 Other long term (current) drug therapy: Secondary | ICD-10-CM | POA: Diagnosis not present

## 2020-08-14 DIAGNOSIS — R7303 Prediabetes: Secondary | ICD-10-CM | POA: Diagnosis not present

## 2020-08-14 DIAGNOSIS — E7800 Pure hypercholesterolemia, unspecified: Secondary | ICD-10-CM | POA: Diagnosis not present

## 2020-08-14 DIAGNOSIS — M2041 Other hammer toe(s) (acquired), right foot: Secondary | ICD-10-CM | POA: Diagnosis not present

## 2020-08-14 DIAGNOSIS — I129 Hypertensive chronic kidney disease with stage 1 through stage 4 chronic kidney disease, or unspecified chronic kidney disease: Secondary | ICD-10-CM | POA: Diagnosis not present

## 2020-08-14 DIAGNOSIS — E7849 Other hyperlipidemia: Secondary | ICD-10-CM | POA: Diagnosis not present

## 2020-08-14 DIAGNOSIS — M205X1 Other deformities of toe(s) (acquired), right foot: Secondary | ICD-10-CM | POA: Diagnosis not present

## 2020-08-14 DIAGNOSIS — N189 Chronic kidney disease, unspecified: Secondary | ICD-10-CM | POA: Diagnosis not present

## 2020-09-18 DIAGNOSIS — H353131 Nonexudative age-related macular degeneration, bilateral, early dry stage: Secondary | ICD-10-CM | POA: Diagnosis not present

## 2020-09-24 ENCOUNTER — Other Ambulatory Visit: Payer: Self-pay

## 2020-09-24 ENCOUNTER — Ambulatory Visit (INDEPENDENT_AMBULATORY_CARE_PROVIDER_SITE_OTHER): Payer: PPO | Admitting: Family Medicine

## 2020-09-24 ENCOUNTER — Encounter: Payer: Self-pay | Admitting: Family Medicine

## 2020-09-24 DIAGNOSIS — N3941 Urge incontinence: Secondary | ICD-10-CM | POA: Diagnosis not present

## 2020-09-24 DIAGNOSIS — M2041 Other hammer toe(s) (acquired), right foot: Secondary | ICD-10-CM

## 2020-09-24 DIAGNOSIS — R7303 Prediabetes: Secondary | ICD-10-CM | POA: Diagnosis not present

## 2020-09-24 DIAGNOSIS — I10 Essential (primary) hypertension: Secondary | ICD-10-CM

## 2020-09-24 DIAGNOSIS — E785 Hyperlipidemia, unspecified: Secondary | ICD-10-CM

## 2020-09-24 LAB — LIPID PANEL
Cholesterol: 214 mg/dL — ABNORMAL HIGH (ref 0–200)
HDL: 54.7 mg/dL (ref >39.00)
LDL Cholesterol: 133 mg/dL — ABNORMAL HIGH (ref 0–99)
NonHDL: 159.76
Total CHOL/HDL Ratio: 4
Triglycerides: 136 mg/dL (ref 0.0–149.0)
VLDL: 27.2 mg/dL (ref 0.0–40.0)

## 2020-09-24 LAB — HEMOGLOBIN A1C: Hgb A1c MFr Bld: 6 % (ref 4.6–6.5)

## 2020-09-24 LAB — COMPREHENSIVE METABOLIC PANEL
ALT: 9 U/L (ref 0–35)
AST: 15 U/L (ref 0–37)
Albumin: 3.9 g/dL (ref 3.5–5.2)
Alkaline Phosphatase: 45 U/L (ref 39–117)
BUN: 19 mg/dL (ref 6–23)
CO2: 30 mEq/L (ref 19–32)
Calcium: 9.5 mg/dL (ref 8.4–10.5)
Chloride: 102 mEq/L (ref 96–112)
Creatinine, Ser: 0.87 mg/dL (ref 0.40–1.20)
GFR: 58.49 mL/min — ABNORMAL LOW (ref >60.00)
Glucose, Bld: 89 mg/dL (ref 70–99)
Potassium: 3.9 mEq/L (ref 3.5–5.1)
Sodium: 138 mEq/L (ref 135–145)
Total Bilirubin: 0.4 mg/dL (ref 0.2–1.2)
Total Protein: 6 g/dL (ref 6.0–8.3)

## 2020-09-24 NOTE — Patient Instructions (Signed)
Nice to see you. We will get some labs today.

## 2020-09-24 NOTE — Assessment & Plan Note (Signed)
Adequate control.  Continue metoprolol 50 mg once daily and Maxzide 1 tablet daily.  Check labs.

## 2020-09-24 NOTE — Assessment & Plan Note (Signed)
Check A1c.  Continue diet changes.

## 2020-09-24 NOTE — Assessment & Plan Note (Signed)
Chronic issue.  Discussed timed voiding.  She will monitor.

## 2020-09-24 NOTE — Assessment & Plan Note (Addendum)
Status post second toe amputation.  Doing well.  Some mild phantom limb like discomfort.  If it does not improve or if it becomes bothersome she will let us know and we can try medication management for this if needed.  She will continue to follow with the podiatrist.

## 2020-09-24 NOTE — Assessment & Plan Note (Signed)
Check lipid panel.  Continue dietary changes.

## 2020-09-24 NOTE — Progress Notes (Signed)
°Eric Sonnenberg, MD °Phone: [phone] ° °Jessica Reynolds is a 86 y.o. female who presents today for follow-up. ° °Hypertension: Not checking blood pressure.  Taking metoprolol and Maxide.  No chest pain.  No changes to chronic dyspnea that rarely occurs.  No edema. ° °Prediabetes: No polyuria or polydipsia.  Patient has cut down on carbohydrates.  Not eating very many sweets.  Eats lots of salads and raw vegetables.  Not much exercise. 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  She had no pain afterwards though is having some phantom limb like aching where the toe used to be that only occurs at night.  She continues to follow with podiatry.  She notes she is not bothered by the aching and is able to sleep with this. ° °Urge incontinence: Patient notes this rarely occurs.  If she does not get to the bathroom quickly enough she will leak a small amount of urine. ° °Social History  ° °Tobacco Use  °Smoking Status Never Smoker  °Smokeless Tobacco Never Used  ° ° ° °ROS see history of present illness ° °Objective ° °Physical Exam °Vitals:  ° 09/24/20 1011  °BP: 115/70  °Pulse: (!) 53  °Temp: 98.3 °F (36.8 °C)  °SpO2: 97%  ° ° °BP Readings from Last 3 Encounters:  °09/24/20 115/70  °03/19/20 120/70  °10/04/19 105/64  ° °Wt Readings from Last 3 Encounters:  °09/24/20 142 lb 6.4 oz (64.6 kg)  °07/09/20 137 lb (62.1 kg)  °03/19/20 137 lb 3.2 oz (62.2 kg)  ° ° °Physical Exam °Constitutional:   °   General: She is not in acute distress. °   Appearance: She is not diaphoretic.  °Cardiovascular:  °   Rate and Rhythm: Normal rate and regular rhythm.  °   Heart sounds: Normal heart sounds.  °Pulmonary:  °   Effort: Pulmonary effort is normal.  °   Breath sounds: Normal breath sounds.  °Musculoskeletal:  °   Right lower leg: No edema.  °   Left lower leg: No edema.  °Skin: °   General: Skin is warm and dry.  °Neurological:  °   Mental Status: She is alert.   ° ° ° ° °Assessment/Plan: Please see individual problem list. ° °Problem List Items Addressed This Visit   ° Benign essential HTN  °  Adequate control.  Continue metoprolol 50 mg once daily and Maxzide 1 tablet daily.  Check labs. °  °  ° Relevant Orders  ° Comp Met (CMET)  ° Hammertoe of right foot  °  Status post second toe amputation.  Doing well.  Some mild phantom limb like discomfort.  If it does not improve or if it becomes bothersome she will let us know and we can try medication management for this if needed.  She will continue to follow with the podiatrist. °  °  ° HLD (hyperlipidemia)  °  Check lipid panel.  Continue dietary changes. °  °  ° Relevant Orders  ° Lipid panel  ° Prediabetes  °  Check A1c.  Continue diet changes. °  °  ° Relevant Orders  ° HgB A1c  ° Urge incontinence  °  Chronic issue.  Discussed timed voiding.  She will monitor. °  °  °  ° ° °Health Maintenance: Patient opted to defer the flu vaccine at this time. ° ° °This visit occurred during the SARS-CoV-2 public health emergency.  Safety protocols were in   place, including screening questions prior to the visit, additional usage of staff PPE, and extensive cleaning of exam room while observing appropriate contact time as indicated for disinfecting solutions.  ° ° °Eric Sonnenberg, MD °[facility] [place] ° °

## 2020-10-01 ENCOUNTER — Other Ambulatory Visit: Payer: Self-pay | Admitting: Family Medicine

## 2020-10-17 DIAGNOSIS — M205X1 Other deformities of toe(s) (acquired), right foot: Secondary | ICD-10-CM | POA: Insufficient documentation

## 2020-10-24 ENCOUNTER — Other Ambulatory Visit: Payer: Self-pay | Admitting: Family Medicine

## 2020-11-30 ENCOUNTER — Ambulatory Visit (INDEPENDENT_AMBULATORY_CARE_PROVIDER_SITE_OTHER): Payer: PPO

## 2020-11-30 ENCOUNTER — Other Ambulatory Visit: Payer: Self-pay

## 2020-11-30 DIAGNOSIS — Z23 Encounter for immunization: Secondary | ICD-10-CM | POA: Diagnosis not present

## 2021-01-07 DIAGNOSIS — I10 Essential (primary) hypertension: Secondary | ICD-10-CM | POA: Diagnosis not present

## 2021-01-07 DIAGNOSIS — R001 Bradycardia, unspecified: Secondary | ICD-10-CM | POA: Diagnosis not present

## 2021-01-07 DIAGNOSIS — I471 Supraventricular tachycardia: Secondary | ICD-10-CM | POA: Diagnosis not present

## 2021-01-07 DIAGNOSIS — E7800 Pure hypercholesterolemia, unspecified: Secondary | ICD-10-CM | POA: Diagnosis not present

## 2021-03-18 DIAGNOSIS — H353131 Nonexudative age-related macular degeneration, bilateral, early dry stage: Secondary | ICD-10-CM | POA: Diagnosis not present

## 2021-03-21 ENCOUNTER — Other Ambulatory Visit: Payer: Self-pay

## 2021-03-25 ENCOUNTER — Other Ambulatory Visit: Payer: Self-pay

## 2021-03-25 ENCOUNTER — Encounter: Payer: Self-pay | Admitting: Family Medicine

## 2021-03-25 ENCOUNTER — Ambulatory Visit (INDEPENDENT_AMBULATORY_CARE_PROVIDER_SITE_OTHER): Payer: PPO | Admitting: Family Medicine

## 2021-03-25 DIAGNOSIS — F419 Anxiety disorder, unspecified: Secondary | ICD-10-CM | POA: Diagnosis not present

## 2021-03-25 DIAGNOSIS — I10 Essential (primary) hypertension: Secondary | ICD-10-CM

## 2021-03-25 DIAGNOSIS — R198 Other specified symptoms and signs involving the digestive system and abdomen: Secondary | ICD-10-CM | POA: Insufficient documentation

## 2021-03-25 DIAGNOSIS — R7303 Prediabetes: Secondary | ICD-10-CM | POA: Diagnosis not present

## 2021-03-25 LAB — BASIC METABOLIC PANEL
BUN: 14 mg/dL (ref 6–23)
CO2: 32 mEq/L (ref 19–32)
Calcium: 9.5 mg/dL (ref 8.4–10.5)
Chloride: 105 mEq/L (ref 96–112)
Creatinine, Ser: 0.91 mg/dL (ref 0.40–1.20)
GFR: 55.4 mL/min — ABNORMAL LOW (ref >60.00)
Glucose, Bld: 83 mg/dL (ref 70–99)
Potassium: 3.9 mEq/L (ref 3.5–5.1)
Sodium: 142 mEq/L (ref 135–145)

## 2021-03-25 LAB — HEMOGLOBIN A1C: Hgb A1c MFr Bld: 5.8 % (ref 4.6–6.5)

## 2021-03-25 NOTE — Assessment & Plan Note (Signed)
Stable on no medication.  She will monitor for any worsening.

## 2021-03-25 NOTE — Assessment & Plan Note (Signed)
The patient is having less frequent bowel movements.  I suspect this is related to her decreased volume of food intake particularly given she does not have any discomfort related to this that would indicate possible constipation.  She will monitor and if she goes further than 3 days between bowel movements or she develops any discomfort she will let us know.

## 2021-03-25 NOTE — Assessment & Plan Note (Signed)
Adequate control for age.  She will continue Maxide half a tablet once daily and metoprolol 50 mg once daily.  Check BMP.

## 2021-03-25 NOTE — Progress Notes (Signed)
Tommi Rumps, MD Phone: [phone]  Omar Orrego Cheetham is a 87 y.o. female who presents today for f/u.  HYPERTENSION  Disease Monitoring  Home BP Monitoring not checking  Chest pain- no    Dyspnea- no Medications  Compliance-  Taking maxzide, metoprolol.  Edema- no  Prediabetes: Patient notes she has not been eating as many sweets or carbohydrates.  She tries to get salads and greens.  Also eats eggs.  She is staying active though no specific exercise.  No polyuria or polydipsia.  Anxiety: Patient notes this is relatively stable.  She notes that she has the usual anxiety for her given that she has a handicapped son who has health issues.  No depression.  Change in bowel movements: Patient notes since she made her dietary changes to eating more greens and less volume of food she has started to have bowel movements every 3 days.  She notes that her normal stools.  There is no abdominal pain or blood in her stools.    Social History   Tobacco Use  Smoking Status Never Smoker  Smokeless Tobacco Never Used    Current Outpatient Medications on File Prior to Visit  Medication Sig Dispense Refill  . aspirin EC 81 MG tablet Take 81 mg by mouth daily.    . calcium carbonate (OS-CAL - DOSED IN MG OF ELEMENTAL CALCIUM) 1250 (500 Ca) MG tablet Take 1 tablet by mouth.    . fluticasone (FLONASE) 50 MCG/ACT nasal spray USE TWO SPRAY(S) IN EACH NOSTRIL ONCE DAILY 16 g 6  . ibuprofen (ADVIL) 200 MG tablet Take 200 mg by mouth every 6 (six) hours as needed.    . metoprolol succinate (TOPROL-XL) 50 MG 24 hr tablet Take 1 tablet by mouth once daily 90 tablet 1  . Multiple Vitamins-Minerals (PRESERVISION/LUTEIN) CAPS Take 2 capsules by mouth daily.    Marland Kitchen triamterene-hydrochlorothiazide (MAXZIDE-25) 37.5-25 MG tablet TAKE 1/2 (ONE-HALF) TABLET BY MOUTH IN THE MORNING 45 tablet 0   No current facility-administered medications on file prior to visit.     ROS see history of present  illness  Objective  Physical Exam Vitals:   03/25/21 0911  BP: 138/70  Pulse: (!) 59  Temp: (!) 97.3 F (36.3 C)  SpO2: 97%    BP Readings from Last 3 Encounters:  03/25/21 138/70  09/24/20 115/70  03/19/20 120/70   Wt Readings from Last 3 Encounters:  03/25/21 137 lb 9.6 oz (62.4 kg)  09/24/20 142 lb 6.4 oz (64.6 kg)  07/09/20 137 lb (62.1 kg)    Physical Exam Constitutional:      General: She is not in acute distress.    Appearance: She is not diaphoretic.  Cardiovascular:     Rate and Rhythm: Normal rate and regular rhythm.     Heart sounds: Normal heart sounds.  Pulmonary:     Effort: Pulmonary effort is normal.     Breath sounds: Normal breath sounds.  Abdominal:     General: Bowel sounds are normal. There is no distension.     Palpations: Abdomen is soft.     Tenderness: There is no abdominal tenderness. There is no guarding or rebound.  Skin:    General: Skin is warm and dry.  Neurological:     Mental Status: She is alert.      Assessment/Plan: Please see individual problem list.  Problem List Items Addressed This Visit    Anxiety    Stable on no medication.  She will monitor for any  worsening.      Benign essential HTN    Adequate control for age.  She will continue Maxide half a tablet once daily and metoprolol 50 mg once daily.  Check BMP.      Relevant Orders   Basic Metabolic Panel (BMET)   Change in bowel movement    The patient is having less frequent bowel movements.  I suspect this is related to her decreased volume of food intake particularly given she does not have any discomfort related to this that would indicate possible constipation.  She will monitor and if she goes further than 3 days between bowel movements or she develops any discomfort she will let us know.      Prediabetes    Check A1c.  Continue dietary changes.      Relevant Orders   HgB A1c       Health Maintenance: She will get her Shingrix vaccine at the  pharmacy.     This visit occurred during the SARS-CoV-2 public health emergency.  Safety protocols were in place, including screening questions prior to the visit, additional usage of staff PPE, and extensive cleaning of exam room while observing appropriate contact time as indicated for disinfecting solutions.    Tommi Rumps, MD [place]

## 2021-03-25 NOTE — Assessment & Plan Note (Signed)
Check A1c.  Continue dietary changes.

## 2021-03-25 NOTE — Patient Instructions (Signed)
Nice to see you. We will check labs and contact you with the results. Please monitor your bowel movements and if you start to go more than 3 days between bowel movements or have any discomfort please let us know.

## 2021-04-17 ENCOUNTER — Other Ambulatory Visit: Payer: Self-pay | Admitting: Family Medicine

## 2021-07-10 ENCOUNTER — Ambulatory Visit: Payer: PPO

## 2021-07-22 ENCOUNTER — Other Ambulatory Visit: Payer: Self-pay | Admitting: Family Medicine

## 2021-08-06 ENCOUNTER — Telehealth: Payer: Self-pay | Admitting: Family Medicine

## 2021-08-06 NOTE — Telephone Encounter (Signed)
Patient informed, Due to the high volume of calls and your symptoms we have to forward your call to our Triage Nurse to expedient your call. Please hold for the transfer.  Patient transferred to Access Nurse. Due to testing negative for COVID last night but she is still having a sore throat and drainage.No openings in office or virtual.

## 2021-08-06 NOTE — Telephone Encounter (Signed)
Patient was instructed on homec are and was told to go to [facility] by access nurse.

## 2021-08-07 DIAGNOSIS — J029 Acute pharyngitis, unspecified: Secondary | ICD-10-CM | POA: Diagnosis not present

## 2021-08-07 DIAGNOSIS — U071 COVID-19: Secondary | ICD-10-CM | POA: Diagnosis not present

## 2021-08-07 NOTE — Telephone Encounter (Signed)
Noted  

## 2021-08-07 NOTE — Telephone Encounter (Signed)
Can you follow-up with her to make sure she was evaluated?

## 2021-08-07 NOTE — Telephone Encounter (Signed)
Patient is currently in [place] being evaluated per chart.

## 2021-08-14 ENCOUNTER — Ambulatory Visit: Payer: PPO

## 2021-08-20 ENCOUNTER — Other Ambulatory Visit: Payer: Self-pay | Admitting: Family Medicine

## 2021-09-03 LAB — HM DEXA SCAN: HM Dexa Scan: NORMAL

## 2021-09-03 LAB — HM DIABETES FOOT EXAM: HM Diabetic Foot Exam: NORMAL

## 2021-09-23 DIAGNOSIS — H353131 Nonexudative age-related macular degeneration, bilateral, early dry stage: Secondary | ICD-10-CM | POA: Diagnosis not present

## 2021-09-30 ENCOUNTER — Ambulatory Visit (INDEPENDENT_AMBULATORY_CARE_PROVIDER_SITE_OTHER): Payer: PPO | Admitting: Family Medicine

## 2021-09-30 ENCOUNTER — Ambulatory Visit (INDEPENDENT_AMBULATORY_CARE_PROVIDER_SITE_OTHER): Payer: PPO

## 2021-09-30 ENCOUNTER — Other Ambulatory Visit: Payer: Self-pay

## 2021-09-30 VITALS — BP 130/70 | HR 58 | Temp 97.6°F | Ht 64.0 in | Wt 139.8 lb

## 2021-09-30 DIAGNOSIS — G629 Polyneuropathy, unspecified: Secondary | ICD-10-CM | POA: Diagnosis not present

## 2021-09-30 DIAGNOSIS — M4056 Lordosis, unspecified, lumbar region: Secondary | ICD-10-CM | POA: Diagnosis not present

## 2021-09-30 DIAGNOSIS — I10 Essential (primary) hypertension: Secondary | ICD-10-CM | POA: Diagnosis not present

## 2021-09-30 DIAGNOSIS — R200 Anesthesia of skin: Secondary | ICD-10-CM | POA: Diagnosis not present

## 2021-09-30 DIAGNOSIS — M47816 Spondylosis without myelopathy or radiculopathy, lumbar region: Secondary | ICD-10-CM | POA: Diagnosis not present

## 2021-09-30 DIAGNOSIS — E785 Hyperlipidemia, unspecified: Secondary | ICD-10-CM

## 2021-09-30 DIAGNOSIS — S32010A Wedge compression fracture of first lumbar vertebra, initial encounter for closed fracture: Secondary | ICD-10-CM | POA: Diagnosis not present

## 2021-09-30 DIAGNOSIS — Z23 Encounter for immunization: Secondary | ICD-10-CM

## 2021-09-30 LAB — COMPREHENSIVE METABOLIC PANEL
ALT: 18 U/L (ref 0–35)
AST: 18 U/L (ref 0–37)
Albumin: 4 g/dL (ref 3.5–5.2)
Alkaline Phosphatase: 48 U/L (ref 39–117)
BUN: 21 mg/dL (ref 6–23)
CO2: 27 mEq/L (ref 19–32)
Calcium: 9.2 mg/dL (ref 8.4–10.5)
Chloride: 105 mEq/L (ref 96–112)
Creatinine, Ser: 0.92 mg/dL (ref 0.40–1.20)
GFR: 54.48 mL/min — ABNORMAL LOW (ref >60.00)
Glucose, Bld: 84 mg/dL (ref 70–99)
Potassium: 4 mEq/L (ref 3.5–5.1)
Sodium: 139 mEq/L (ref 135–145)
Total Bilirubin: 0.5 mg/dL (ref 0.2–1.2)
Total Protein: 5.8 g/dL — ABNORMAL LOW (ref 6.0–8.3)

## 2021-09-30 LAB — HEMOGLOBIN A1C: Hgb A1c MFr Bld: 5.8 % (ref 4.6–6.5)

## 2021-09-30 LAB — VITAMIN B12: Vitamin B-12: 530 pg/mL (ref 211–911)

## 2021-09-30 NOTE — Assessment & Plan Note (Signed)
Adequate control for age.  She will continue metoprolol XL 50 mg once daily and Maxzide half a tablet daily.  Check labs.

## 2021-09-30 NOTE — Progress Notes (Signed)
Tommi Rumps, MD Phone: [phone]  Jessica Reynolds is a 87 y.o. female who presents today for follow-up.  Hypertension: Not checking blood pressures at home.  She is taking metoprolol and Maxzide.  She has no chest pain.  She has chronic stable dyspnea that has been going on most of her life when she is active.  She has chronic left foot edema that has not changed.  Hyperlipidemia: No history of stroke or heart attack.  She eats minimal meat.  She does eat eggs, cheese, salads.  She does not eat pasta or potatoes.  She tries to limit the sweets.  She has not been playing as much tennis.  Though she is active most of the day by working in the yard.  Toe tingling/thigh numbness: Patient notes at least for the last year or so her bilateral toes have been tingling.  There is no numbness in the toes.  The right foot started sooner than the left foot.  She also reports bilateral anterior thigh numbness that only occurs when she is walking uphill.  There is no weakness.  She reports no back pain.  Social History   Tobacco Use  Smoking Status Never  Smokeless Tobacco Never    Current Outpatient Medications on File Prior to Visit  Medication Sig Dispense Refill   aspirin EC 81 MG tablet Take 81 mg by mouth daily.     fluticasone (FLONASE) 50 MCG/ACT nasal spray Use 2 spray(s) in each nostril once daily 16 g 0   ibuprofen (ADVIL) 200 MG tablet Take 200 mg by mouth every 6 (six) hours as needed.     metoprolol succinate (TOPROL-XL) 50 MG 24 hr tablet Take 1 tablet by mouth once daily 90 tablet 1   Multiple Vitamins-Minerals (PRESERVISION/LUTEIN) CAPS Take 2 capsules by mouth daily.     triamterene-hydrochlorothiazide (MAXZIDE-25) 37.5-25 MG tablet TAKE 1/2 (ONE-HALF) TABLET BY MOUTH IN THE MORNING 45 tablet 1   No current facility-administered medications on file prior to visit.     ROS see history of present illness  Objective  Physical Exam Vitals:   09/30/21 0922  BP: 130/70   Pulse: (!) 58  Temp: 97.6 F (36.4 C)  SpO2: 99%    BP Readings from Last 3 Encounters:  09/30/21 130/70  03/25/21 138/70  09/24/20 115/70   Wt Readings from Last 3 Encounters:  09/30/21 139 lb 12.8 oz (63.4 kg)  03/25/21 137 lb 9.6 oz (62.4 kg)  09/24/20 142 lb 6.4 oz (64.6 kg)    Physical Exam Constitutional:      General: She is not in acute distress.    Appearance: She is not diaphoretic.  Cardiovascular:     Rate and Rhythm: Normal rate and regular rhythm.     Heart sounds: Normal heart sounds.  Pulmonary:     Effort: Pulmonary effort is normal.     Breath sounds: Normal breath sounds.  Musculoskeletal:     Comments: No midline spine tenderness, no midline spine step-off, no muscular back tenderness  Skin:    General: Skin is warm and dry.  Neurological:     Mental Status: She is alert.     Comments: 5/5 strength in bilateral quads, hamstrings, plantar and dorsiflexion, sensation to light touch intact in bilateral LE, sensation to monofilament intact bilateral feet, normal gait     Assessment/Plan: Please see individual problem list.  Problem List Items Addressed This Visit     Benign essential HTN - Primary    Adequate  control for age.  She will continue metoprolol XL 50 mg once daily and Maxzide half a tablet daily.  Check labs.      Relevant Orders   Comp Met (CMET)   HLD (hyperlipidemia)    She will try to monitor her diet as she has been.  She will remain active.  I discussed given her age and lack of a cardiovascular event she does not need to go on cholesterol medication and thus we will defer further lipid panels.      Neuropathy    Discussed that the tingling in her feet likely represents neuropathy.  Discussed that this would be less likely to be related to a nerve impingement given that this is occurring in all of her toes.  Discussed work-up including an A1c and a B12.  Discussed that the results would guide our treatment.      Relevant Orders    B12   HgB A1c   Numbness of legs    Only occurring in the bilateral anterior thighs when walking uphill.  Discussed that this could represent nerve impingement.  We will get an x-ray today to evaluate her low back.      Relevant Orders   DG Lumbar Spine Complete   Other Visit Diagnoses     Need for immunization against influenza       Relevant Orders   Flu Vaccine QUAD High Dose(Fluad) (Completed)        Health Maintenance: She was encouraged to get her Shingrix vaccine at the pharmacy.  Return in about 6 months (around 03/31/2022) for Hypertension.  This visit occurred during the SARS-CoV-2 public health emergency.  Safety protocols were in place, including screening questions prior to the visit, additional usage of staff PPE, and extensive cleaning of exam room while observing appropriate contact time as indicated for disinfecting solutions.    Tommi Rumps, MD [place]

## 2021-09-30 NOTE — Assessment & Plan Note (Signed)
Only occurring in the bilateral anterior thighs when walking uphill.  Discussed that this could represent nerve impingement.  We will get an x-ray today to evaluate her low back.

## 2021-09-30 NOTE — Assessment & Plan Note (Signed)
She will try to monitor her diet as she has been.  She will remain active.  I discussed given her age and lack of a cardiovascular event she does not need to go on cholesterol medication and thus we will defer further lipid panels.

## 2021-09-30 NOTE — Assessment & Plan Note (Signed)
Discussed that the tingling in her feet likely represents neuropathy.  Discussed that this would be less likely to be related to a nerve impingement given that this is occurring in all of her toes.  Discussed work-up including an A1c and a B12.  Discussed that the results would guide our treatment.

## 2021-09-30 NOTE — Patient Instructions (Signed)
Nice to see you. We will get labs today and an x-ray to help figure out the cause of your leg symptoms.  We will contact you with these results.

## 2021-10-02 ENCOUNTER — Telehealth: Payer: Self-pay

## 2021-10-02 ENCOUNTER — Ambulatory Visit (INDEPENDENT_AMBULATORY_CARE_PROVIDER_SITE_OTHER): Payer: PPO

## 2021-10-02 VITALS — Ht 64.0 in | Wt 139.0 lb

## 2021-10-02 DIAGNOSIS — Z0000 Encounter for general adult medical examination without abnormal findings: Secondary | ICD-10-CM | POA: Diagnosis not present

## 2021-10-02 DIAGNOSIS — S32010A Wedge compression fracture of first lumbar vertebra, initial encounter for closed fracture: Secondary | ICD-10-CM

## 2021-10-02 NOTE — Patient Instructions (Addendum)
Jessica Reynolds , Thank you for taking time to come for your Medicare Wellness Visit. I appreciate your ongoing commitment to your health goals. Please review the following plan we discussed and let me know if I can assist you in the future.   These are the goals we discussed:  Goals       Patient Stated     Follow up with Primary Care Provider (pt-stated)      Follow up as needed Wear hearing aids more often for better quality of life Increase protein in diet        This is a list of the screening recommended for you and due dates:  Health Maintenance  Topic Date Due   Zoster (Shingles) Vaccine (1 of 2) 01/02/2022*   COVID-19 Vaccine (5 - Booster for Pfizer series) 01/13/2022   Tetanus Vaccine  01/18/2023   Pneumonia Vaccine  Completed   Flu Shot  Completed   DEXA scan (bone density measurement)  Completed   HPV Vaccine  Aged Out  *Topic was postponed. The date shown is not the original due date.    Advanced directives: End of life planning; Advance aging; Advanced directives discussed.  Copy of current HCPOA/Living Will requested.    Read results of most recent labs/imaging to patient per pcp note. Patient verbalized understanding and agrees to receive MRI. Denies metal in the body, pacemaker, working with metal or machine shop. Agrees to start gabapentin for neuropathy symptoms. Nurse encourages patient to call the office back with any additional questions or concerns.  Agrees to ad protein to diet via one Boost or Ensure daily. None at this time. Deferred to pcp for follow up per telephone note.   Follow up in one year for your annual wellness visit    Preventive Care 65 Years and Older, Female Preventive care refers to lifestyle choices and visits with your health care provider that can promote health and wellness. What does preventive care include? A yearly physical exam. This is also called an annual well check. Dental exams once or twice a year. Routine eye exams. Ask  your health care provider how often you should have your eyes checked. Personal lifestyle choices, including: Daily care of your teeth and gums. Regular physical activity. Eating a healthy diet. Avoiding tobacco and drug use. Limiting alcohol use. Practicing safe sex. Taking low-dose aspirin every day. Taking vitamin and mineral supplements as recommended by your health care provider. What happens during an annual well check? The services and screenings done by your health care provider during your annual well check will depend on your age, overall health, lifestyle risk factors, and family history of disease. Counseling  Your health care provider may ask you questions about your: Alcohol use. Tobacco use. Drug use. Emotional well-being. Home and relationship well-being. Sexual activity. Eating habits. History of falls. Memory and ability to understand (cognition). Work and work Statistician. Reproductive health. Screening  You may have the following tests or measurements: Height, weight, and BMI. Blood pressure. Lipid and cholesterol levels. These may be checked every 5 years, or more frequently if you are over 24 years old. Skin check. Lung cancer screening. You may have this screening every year starting at age 26 if you have a 30-pack-year history of smoking and currently smoke or have quit within the past 15 years. Fecal occult blood test (FOBT) of the stool. You may have this test every year starting at age 17. Flexible sigmoidoscopy or colonoscopy. You may have a sigmoidoscopy every  5 years or a colonoscopy every 10 years starting at age 15. Hepatitis C blood test. Hepatitis B blood test. Sexually transmitted disease (STD) testing. Diabetes screening. This is done by checking your blood sugar (glucose) after you have not eaten for a while (fasting). You may have this done every 1-3 years. Bone density scan. This is done to screen for osteoporosis. You may have this done  starting at age 73. Mammogram. This may be done every 1-2 years. Talk to your health care provider about how often you should have regular mammograms. Talk with your health care provider about your test results, treatment options, and if necessary, the need for more tests. Vaccines  Your health care provider may recommend certain vaccines, such as: Influenza vaccine. This is recommended every year. Tetanus, diphtheria, and acellular pertussis (Tdap, Td) vaccine. You may need a Td booster every 10 years. Zoster vaccine. You may need this after age 5. Pneumococcal 13-valent conjugate (PCV13) vaccine. One dose is recommended after age 59. Pneumococcal polysaccharide (PPSV23) vaccine. One dose is recommended after age 68. Talk to your health care provider about which screenings and vaccines you need and how often you need them. This information is not intended to replace advice given to you by your health care provider. Make sure you discuss any questions you have with your health care provider. Document Released: 12/28/2015 Document Revised: 08/20/2016 Document Reviewed: 10/02/2015 Elsevier Interactive Patient Education  2017 [employer] Prevention in the Home Falls can cause injuries. They can happen to people of all ages. There are many things you can do to make your home safe and to help prevent falls. What can I do on the outside of my home? Regularly fix the edges of walkways and driveways and fix any cracks. Remove anything that might make you trip as you walk through a door, such as a raised step or threshold. Trim any bushes or trees on the path to your home. Use bright outdoor lighting. Clear any walking paths of anything that might make someone trip, such as rocks or tools. Regularly check to see if handrails are loose or broken. Make sure that both sides of any steps have handrails. Any raised decks and porches should have guardrails on the edges. Have any leaves, snow, or  ice cleared regularly. Use sand or salt on walking paths during winter. Clean up any spills in your garage right away. This includes oil or grease spills. What can I do in the bathroom? Use night lights. Install grab bars by the toilet and in the tub and shower. Do not use towel bars as grab bars. Use non-skid mats or decals in the tub or shower. If you need to sit down in the shower, use a plastic, non-slip stool. Keep the floor dry. Clean up any water that spills on the floor as soon as it happens. Remove soap buildup in the tub or shower regularly. Attach bath mats securely with double-sided non-slip rug tape. Do not have throw rugs and other things on the floor that can make you trip. What can I do in the bedroom? Use night lights. Make sure that you have a light by your bed that is easy to reach. Do not use any sheets or blankets that are too big for your bed. They should not hang down onto the floor. Have a firm chair that has side arms. You can use this for support while you get dressed. Do not have throw rugs and other things on  the floor that can make you trip. What can I do in the kitchen? Clean up any spills right away. Avoid walking on wet floors. Keep items that you use a lot in easy-to-reach places. If you need to reach something above you, use a strong step stool that has a grab bar. Keep electrical cords out of the way. Do not use floor polish or wax that makes floors slippery. If you must use wax, use non-skid floor wax. Do not have throw rugs and other things on the floor that can make you trip. What can I do with my stairs? Do not leave any items on the stairs. Make sure that there are handrails on both sides of the stairs and use them. Fix handrails that are broken or loose. Make sure that handrails are as long as the stairways. Check any carpeting to make sure that it is firmly attached to the stairs. Fix any carpet that is loose or worn. Avoid having throw rugs at  the top or bottom of the stairs. If you do have throw rugs, attach them to the floor with carpet tape. Make sure that you have a light switch at the top of the stairs and the bottom of the stairs. If you do not have them, ask someone to add them for you. What else can I do to help prevent falls? Wear shoes that: Do not have high heels. Have rubber bottoms. Are comfortable and fit you well. Are closed at the toe. Do not wear sandals. If you use a stepladder: Make sure that it is fully opened. Do not climb a closed stepladder. Make sure that both sides of the stepladder are locked into place. Ask someone to hold it for you, if possible. Clearly mark and make sure that you can see: Any grab bars or handrails. First and last steps. Where the edge of each step is. Use tools that help you move around (mobility aids) if they are needed. These include: Canes. Walkers. Scooters. Crutches. Turn on the lights when you go into a dark area. Replace any light bulbs as soon as they burn out. Set up your furniture so you have a clear path. Avoid moving your furniture around. If any of your floors are uneven, fix them. If there are any pets around you, be aware of where they are. Review your medicines with your doctor. Some medicines can make you feel dizzy. This can increase your chance of falling. Ask your doctor what other things that you can do to help prevent falls. This information is not intended to replace advice given to you by your health care provider. Make sure you discuss any questions you have with your health care provider. Document Released: 09/27/2009 Document Revised: 05/08/2016 Document Reviewed: 01/05/2015 Elsevier Interactive Patient Education  2017 [employer].

## 2021-10-02 NOTE — Progress Notes (Signed)
Subjective:   Jessica Reynolds is a 87 y.o. female who presents for Medicare Annual (Subsequent) preventive examination.  Review of Systems    No ROS.  Medicare Wellness Virtual Visit.  Visual/audio telehealth visit, UTA vital signs.   See social history for additional risk factors.   Cardiac Risk Factors include: advanced age (>81men, >75 women);hypertension     Objective:    Today's Vitals   10/02/21 1409  Weight: 139 lb (63 kg)  Height: 5\' 4"  (1.626 m)   Body mass index is 23.86 kg/m.  Advanced Directives 10/02/2021 07/09/2020 10/04/2019 07/07/2019 07/05/2018 01/09/2016  Does Patient Have a Medical Advance Directive? Yes Yes Yes Yes Yes No;Yes  Type of Paramedic of [place];Living will [place];Living will [place];Living will [place] will  Does patient want to make changes to medical advance directive? No - Patient declined No - Patient declined No - Patient declined No - Patient declined No - Patient declined -  Copy of [place] in Chart? No - copy requested No - copy requested No - copy requested No - copy requested No - copy requested -    Current Medications (verified) Outpatient Encounter Medications as of 10/02/2021  Medication Sig   aspirin EC 81 MG tablet Take 81 mg by mouth daily.   fluticasone (FLONASE) 50 MCG/ACT nasal spray Use 2 spray(s) in each nostril once daily   ibuprofen (ADVIL) 200 MG tablet Take 200 mg by mouth every 6 (six) hours as needed.   metoprolol succinate (TOPROL-XL) 50 MG 24 hr tablet Take 1 tablet by mouth once daily   Multiple Vitamins-Minerals (PRESERVISION/LUTEIN) CAPS Take 2 capsules by mouth daily.   triamterene-hydrochlorothiazide (MAXZIDE-25) 37.5-25 MG tablet TAKE 1/2 (ONE-HALF) TABLET BY MOUTH IN THE MORNING   No facility-administered encounter medications on file as of 10/02/2021.     Allergies (verified) Celecoxib   History: Past Medical History:  Diagnosis Date   Arrhythmia    Arthritis    shoulder   Colon polyp    Headache    migraines   HOH (hard of hearing)    wears aids   Hypercholesterolemia    Hypertension    Osteoarthritis    UTI (lower urinary tract infection)    Past Surgical History:  Procedure Laterality Date   ABDOMINAL HYSTERECTOMY  1980   BSO, fibroids   ACHILLES TENDON SURGERY Right 1990   ACHILLES TENDON SURGERY Left 1997   CATARACT EXTRACTION W/PHACO Right 09/13/2019   Procedure: CATARACT EXTRACTION PHACO AND INTRAOCULAR LENS PLACEMENT (IOC) RIGHT 01:01.4           11.2%           6.93;  Surgeon: Birder Robson, MD;  Location: [facility];  Service: Ophthalmology;  Laterality: Right;   CATARACT EXTRACTION W/PHACO Left 10/04/2019   Procedure: CATARACT EXTRACTION PHACO AND INTRAOCULAR LENS PLACEMENT (IOC) LEFT  00:53.5  15.5%  8.31;  Surgeon: Birder Robson, MD;  Location: [facility];  Service: Ophthalmology;  Laterality: Left;   ECTOPIC PREGNANCY SURGERY  1960   VAGINAL DELIVERY     4   Family History  Problem Relation Age of Onset   Stroke Mother    Heart disease Father    Macular degeneration Sister    Diabetes Sister    Breast cancer Neg Hx    Social History   Socioeconomic History   Marital status: Widowed  Spouse name: Not on file   Number of children: Not on file   Years of education: Not on file   Highest education level: Not on file  Occupational History   Not on file  Tobacco Use   Smoking status: Never   Smokeless tobacco: Never  Vaping Use   Vaping Use: Never used  Substance and Sexual Activity   Alcohol use: Yes    Comment: social   Drug use: No   Sexual activity: Not on file  Other Topics Concern   Not on file  Social History Narrative   Born in [place], [place].      Lives in [place] with son. Husband passed away 03/05/12. No pets.      Work - retired for [employer] - regular      Exercise - tennis,  3-5 days per week   Social Determinants of Radio broadcast assistant Strain: Low Risk    Difficulty of Paying Living Expenses: Not hard at all  Food Insecurity: No Food Insecurity   Worried About Charity fundraiser in the Last Year: Never true   Arboriculturist in the Last Year: Never true  Transportation Needs: No Transportation Needs   Lack of Transportation (Medical): No   Lack of Transportation (Non-Medical): No  Physical Activity: Sufficiently Active   Days of Exercise per Week: 3 days   Minutes of Exercise per Session: 120 min  Stress: No Stress Concern Present   Feeling of Stress : Not at all  Social Connections: Unknown   Frequency of Communication with Friends and Family: More than three times a week   Frequency of Social Gatherings with Friends and Family: Not on file   Attends Religious Services: Not on Electrical engineer or Organizations: Not on file   Attends Archivist Meetings: Not on file   Marital Status: Not on file    Tobacco Counseling Counseling given: Not Answered   Clinical Intake:  Pre-visit preparation completed: Yes        Diabetes: No  How often do you need to have someone help you when you read instructions, pamphlets, or other written materials from your doctor or pharmacy?: 1 - Never    Interpreter Needed?: No      Activities of Daily Living In your present state of health, do you have any difficulty performing the following activities: 10/02/2021  Hearing? Y  Comment Hearing aids  Vision? N  Difficulty concentrating or making decisions? N  Walking or climbing stairs? N  Dressing or bathing? N  Doing errands, shopping? N  Preparing Food and eating ? N  Using the Toilet? N  In the past six months, have you accidently leaked urine? N  Do you have problems with loss of bowel control? N  Managing your Medications? N  Managing your  Finances? N  Housekeeping or managing your Housekeeping? N  Some recent data might be hidden    Patient Care Team: Leone Haven, MD as PCP - General (Family Medicine) Jackolyn Confer, MD (Internal Medicine)  Indicate any recent Medical Services you may have received from other than [facility] providers in the past year (date may be approximate).     Assessment:   This is a routine wellness examination for Jessica Reynolds.  I connected with Jessica Reynolds today by telephone and verified that I am speaking with the correct person using two identifiers. Location patient:  home Location provider: work Persons participating in the virtual visit: patient, nurse.    I discussed the limitations, risks, security and privacy concerns of performing an evaluation and management service by telephone and the availability of in person appointments. The patient expressed understanding and verbally consented to this telephonic visit.    Interactive audio and video telecommunications were attempted between this provider and patient, however failed, due to patient having technical difficulties OR patient did not have access to video capability.  We continued and completed visit with audio only.  Some vital signs may be absent or patient reported.   Hearing/Vision screen Hearing Screening - Comments:: Hearing aids Vision Screening - Comments:: Followed by [facility] (Dr. Jeni Salles)  Wears corrective lenses  Macular degeneration  They have regular follow up with the ophthalmologist  Dietary issues and exercise activities discussed: Current Exercise Habits: Home exercise routine, Type of exercise: walking (Tennis), Intensity: Mild Regular diet Agrees to add Boost or Ensure daily; increase protein.    Goals Addressed               This Visit's Progress     Patient Stated     Follow up with Primary Care Provider (pt-stated)   On track     Follow up as needed Wear hearing aids more often for better  quality of life Increase protein in diet       Depression Screen PHQ 2/9 Scores 10/02/2021 09/30/2021 03/25/2021 09/24/2020 07/09/2020 11/15/2019 07/06/2019  PHQ - 2 Score 0 0 0 0 0 0 0  PHQ- 9 Score - - - - - - -    Fall Risk Fall Risk  10/02/2021 09/30/2021 03/25/2021 09/24/2020 07/10/2020  Falls in the past year? 0 0 0 0 0  Comment - - - - Emmi Telephone Survey: data to providers prior to load  Number falls in past yr: 0 0 0 0 -  Injury with Fall? - - - - -  Follow up Falls evaluation completed Falls evaluation completed Falls evaluation completed Falls evaluation completed -    FALL RISK PREVENTION PERTAINING TO THE HOME: Home free of loose throw rugs in walkways, pet beds, electrical cords, etc? Yes  Adequate lighting in your home to reduce risk of falls? Yes   ASSISTIVE DEVICES UTILIZED TO PREVENT FALLS: Life alert? No  Use of a cane, walker or w/c? No  Handle bars in the bathroom? Yes  Shower chair or bench in shower? No  Elevated toilet seat or a handicapped toilet? Yes   TIMED UP AND GO: Was the test performed? No . Virtual visit.   Cognitive Function:  Patient is alert and oriented x3.  Enjoys playing bridge card game and more brain health exercises.  MMSE/6CIT deferred. Normal by direct communication/observation.    6CIT Screen 07/09/2020 07/07/2019 07/05/2018  What Year? 0 points 0 points 0 points  What month? 0 points 0 points 0 points  What time? - 0 points 0 points  Count back from 20 - 0 points 0 points  Months in reverse 0 points 0 points 0 points  Repeat phrase 4 points 0 points 0 points  Total Score - 0 0    Immunizations Immunization History  Administered Date(s) Administered   Fluad Quad(high Dose 65+) 10/12/2019, 11/30/2020, 09/30/2021   Influenza Split 09/14/2014, 10/11/2014   Influenza, High Dose Seasonal PF 11/16/2017, 11/05/2018   Influenza,inj,Quad PF,6+ Mos 10/24/2015   Influenza-Unspecified 12/22/2016   PFIZER(Purple Top)SARS-COV-2  Vaccination 12/29/2019, 01/19/2020, 09/15/2020, 09/13/2021  Pneumococcal Conjugate-13 01/28/2016   Pneumococcal Polysaccharide-23 12/15/1998, 03/12/2011   Td 01/18/2013   Zoster, Live 07/05/2007, 01/22/2009   Health Maintenance Health Maintenance  Topic Date Due   Zoster Vaccines- Shingrix (1 of 2) 01/02/2022 (Originally 05/11/1947)   COVID-19 Vaccine (5 - Booster for Pfizer series) 01/13/2022   TETANUS/TDAP  01/18/2023   Pneumonia Vaccine 67+ Years old  Completed   INFLUENZA VACCINE  Completed   DEXA SCAN  Completed   HPV VACCINES  Aged Out   Mammogram status: No longer required due to aged out.  Lung Cancer Screening: (Low Dose CT Chest recommended if Age 76-80 years, 30 pack-year currently smoking OR have quit w/in 15years.) does not qualify.   Hepatitis C Screening: does not qualify  Vision Screening: Recommended annual ophthalmology exams for early detection of glaucoma and other disorders of the eye.  Dental Screening: Recommended annual dental exams for proper oral hygiene  Community Resource Referral / Chronic Care Management: CRR required this visit?  No   CCM required this visit?  No      Plan:   Keep all routine maintenance appointments.    Notifications to patient: Read results of most recent labs/imaging to patient per pcp note. Patient verbalized understanding and agrees to receive MRI. Denies metal in the body, pacemaker, working with metal or machine shop. Agrees to start gabapentin for neuropathy symptoms. Nurse encourages patient to call the office back with any additional questions or concerns.  Agrees to ad protein to diet via one Boost or Ensure daily. None at this time. Deferred to pcp for follow up per telephone note.   I have personally reviewed and noted the following in the patient's chart:   Medical and social history Use of alcohol, tobacco or illicit drugs  Current medications and supplements including opioid prescriptions. Not taking opioids.   Functional ability and status Nutritional status Physical activity Advanced directives List of other physicians Hospitalizations, surgeries, and ER visits in previous 12 months Vitals Screenings to include cognitive, depression, and falls Referrals and appointments  In addition, I have reviewed and discussed with patient certain preventive protocols, quality metrics, and best practice recommendations. A written personalized care plan for preventive services as well as general preventive health recommendations were provided to patient.     Varney Biles, LPN   [id number]

## 2021-10-02 NOTE — Telephone Encounter (Signed)
Patient seen for AWV today.  Read results of most recent labs/imaging to patient per pcp note. Patient verbalized understanding and agrees to receive MRI. Denies metal in the body, pacemaker, working with metal or machine shop. Agrees to start gabapentin for neuropathy symptoms. Nurse encourages patient to call the office back with any additional questions or concerns. None at this time.

## 2021-10-03 ENCOUNTER — Telehealth: Payer: Self-pay | Admitting: Family Medicine

## 2021-10-03 NOTE — Telephone Encounter (Signed)
Lft pt vm to call ofc to sch MRI. thanks 

## 2021-10-03 NOTE — Telephone Encounter (Signed)
Noted.  MRI ordered.

## 2021-10-11 DIAGNOSIS — R002 Palpitations: Secondary | ICD-10-CM | POA: Diagnosis not present

## 2021-10-11 DIAGNOSIS — I10 Essential (primary) hypertension: Secondary | ICD-10-CM | POA: Diagnosis not present

## 2021-10-11 DIAGNOSIS — R001 Bradycardia, unspecified: Secondary | ICD-10-CM | POA: Diagnosis not present

## 2021-10-11 DIAGNOSIS — I471 Supraventricular tachycardia: Secondary | ICD-10-CM | POA: Diagnosis not present

## 2021-10-11 DIAGNOSIS — R0602 Shortness of breath: Secondary | ICD-10-CM | POA: Diagnosis not present

## 2021-10-15 ENCOUNTER — Ambulatory Visit
Admission: RE | Admit: 2021-10-15 | Discharge: 2021-10-15 | Disposition: A | Discharge status: Home / Self Care | Payer: PPO | Source: Ambulatory Visit | Attending: Family Medicine | Admitting: Family Medicine

## 2021-10-15 ENCOUNTER — Other Ambulatory Visit: Payer: Self-pay

## 2021-10-15 DIAGNOSIS — M545 Low back pain, unspecified: Secondary | ICD-10-CM | POA: Diagnosis not present

## 2021-10-15 DIAGNOSIS — S32010A Wedge compression fracture of first lumbar vertebra, initial encounter for closed fracture: Secondary | ICD-10-CM | POA: Insufficient documentation

## 2021-10-28 DIAGNOSIS — I471 Supraventricular tachycardia: Secondary | ICD-10-CM | POA: Diagnosis not present

## 2021-11-04 DIAGNOSIS — I471 Supraventricular tachycardia: Secondary | ICD-10-CM | POA: Diagnosis not present

## 2021-11-04 DIAGNOSIS — R0602 Shortness of breath: Secondary | ICD-10-CM | POA: Diagnosis not present

## 2021-11-18 DIAGNOSIS — R001 Bradycardia, unspecified: Secondary | ICD-10-CM | POA: Diagnosis not present

## 2021-11-18 DIAGNOSIS — E7800 Pure hypercholesterolemia, unspecified: Secondary | ICD-10-CM | POA: Diagnosis not present

## 2021-11-18 DIAGNOSIS — R002 Palpitations: Secondary | ICD-10-CM | POA: Diagnosis not present

## 2021-11-18 DIAGNOSIS — I493 Ventricular premature depolarization: Secondary | ICD-10-CM | POA: Insufficient documentation

## 2021-11-18 DIAGNOSIS — I471 Supraventricular tachycardia: Secondary | ICD-10-CM | POA: Diagnosis not present

## 2021-11-18 DIAGNOSIS — I10 Essential (primary) hypertension: Secondary | ICD-10-CM | POA: Diagnosis not present

## 2021-11-22 ENCOUNTER — Telehealth: Payer: Self-pay | Admitting: Family Medicine

## 2021-11-22 DIAGNOSIS — N390 Urinary tract infection, site not specified: Secondary | ICD-10-CM | POA: Diagnosis not present

## 2021-11-22 DIAGNOSIS — R300 Dysuria: Secondary | ICD-10-CM | POA: Diagnosis not present

## 2021-11-22 NOTE — Telephone Encounter (Signed)
Pt called in regards to UTI she believes she is experiencing  , due to no in office appt available pt was advised to go to urgent care and was offered to be put on their wait list. Pt did not like this option and disconnected.

## 2022-01-04 ENCOUNTER — Other Ambulatory Visit: Payer: Self-pay | Admitting: Family Medicine

## 2022-03-31 ENCOUNTER — Ambulatory Visit: Payer: PPO | Admitting: Family Medicine

## 2022-04-08 ENCOUNTER — Ambulatory Visit (INDEPENDENT_AMBULATORY_CARE_PROVIDER_SITE_OTHER): Payer: PPO | Admitting: Family Medicine

## 2022-04-08 ENCOUNTER — Telehealth: Payer: Self-pay

## 2022-04-08 DIAGNOSIS — F439 Reaction to severe stress, unspecified: Secondary | ICD-10-CM

## 2022-04-08 DIAGNOSIS — I10 Essential (primary) hypertension: Secondary | ICD-10-CM | POA: Diagnosis not present

## 2022-04-08 DIAGNOSIS — R002 Palpitations: Secondary | ICD-10-CM

## 2022-04-08 DIAGNOSIS — E785 Hyperlipidemia, unspecified: Secondary | ICD-10-CM | POA: Diagnosis not present

## 2022-04-08 DIAGNOSIS — H3530 Unspecified macular degeneration: Secondary | ICD-10-CM

## 2022-04-08 DIAGNOSIS — R7303 Prediabetes: Secondary | ICD-10-CM | POA: Diagnosis not present

## 2022-04-08 LAB — LIPID PANEL
Cholesterol: 210 mg/dL — ABNORMAL HIGH (ref 0–200)
HDL: 60.3 mg/dL (ref >39.00)
LDL Cholesterol: 131 mg/dL — ABNORMAL HIGH (ref 0–99)
NonHDL: 149.74
Total CHOL/HDL Ratio: 3
Triglycerides: 95 mg/dL (ref 0.0–149.0)
VLDL: 19 mg/dL (ref 0.0–40.0)

## 2022-04-08 LAB — BASIC METABOLIC PANEL
BUN: 17 mg/dL (ref 6–23)
CO2: 27 mEq/L (ref 19–32)
Calcium: 9.3 mg/dL (ref 8.4–10.5)
Chloride: 105 mEq/L (ref 96–112)
Creatinine, Ser: 0.82 mg/dL (ref 0.40–1.20)
GFR: 62.32 mL/min (ref >60.00)
Glucose, Bld: 91 mg/dL (ref 70–99)
Potassium: 3.7 mEq/L (ref 3.5–5.1)
Sodium: 140 mEq/L (ref 135–145)

## 2022-04-08 LAB — HEMOGLOBIN A1C: Hgb A1c MFr Bld: 6 % (ref 4.6–6.5)

## 2022-04-08 NOTE — Assessment & Plan Note (Signed)
Check A1c.  I encouraged continued healthy diet.  Discussed remaining active.  Advised to eat a snack midmorning to prevent her from getting shaky. ?

## 2022-04-08 NOTE — Patient Instructions (Signed)
Nice to see you. ?We will call you with your lab results. ?A Education officer, museum should call you to discuss community resources and provide any counseling you need regarding your stress. ?

## 2022-04-08 NOTE — Chronic Care Management (AMB) (Signed)
?  Care Management  ? ?Note ? ?04/08/2022 ?Name: Jessica Reynolds MRN: [id number] DOB: 1928/06/28 ? ?[name] is a 88 y.o. year old female who is a primary care patient of Caryl Bis, Angela Adam, MD. I reached out to Lorenso Quarry by phone today offer care coordination services.  ? ?Ms. Tatum was given information about care management services today including:  ?Care management services include personalized support from designated clinical staff supervised by her physician, including individualized plan of care and coordination with other care providers ?24/7 contact phone numbers for assistance for urgent and routine care needs. ?The patient may stop care management services at any time by phone call to the office staff. ? ?Patient agreed to services and verbal consent obtained.  ? ?Follow up plan: ?Telephone appointment with care management team member scheduled for:04/16/2022 ? ? ?Noreene Larsson, RMA ?Care Guide, Embedded Care Coordination ?[facility]  Care Management  ?[place],  [id number] ?Direct Dial: [phone] ?Museum/gallery [email or web address] ?Website: [email or web address]  ? ?

## 2022-04-08 NOTE — Progress Notes (Signed)
?Tommi Rumps, MD ?Phone: [phone] ? ?Jessica Reynolds is a 88 y.o. female who presents today for f/u. ? ?HYPERTENSION ?Disease Monitoring ?Home BP Monitoring not checking Chest pain- no    Dyspnea- chronic and stable ?Medications ?Compliance-  taking metoprolol, maxzide.  ?BMET ?   ?Component Value Date/Time  ? NA 139 09/30/2021 0932  ? K 4.0 09/30/2021 0932  ? CL 105 09/30/2021 0932  ? CO2 27 09/30/2021 0932  ? GLUCOSE 84 09/30/2021 0932  ? BUN 21 09/30/2021 0932  ? CREATININE 0.92 09/30/2021 0932  ? CALCIUM 9.2 09/30/2021 0932  ? GFRNONAA 51 (L) 01/09/2016 1740  ? GFRAA 59 (L) 01/09/2016 1740  ? ?Prediabetes: Patient notes she is eating mostly salads.  She has been eating some sweets.  She tries to limit starches.  Has not had much time to exercise as she has been very busy though does remain active.  She does feel a little shaky midmorning though if she eats an apple this resolves. ? 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  She continues to see ophthalmology.  There is some concern that she may lose her license.  She is waiting on a final vision test from the [facility]. ? ?Palpitations: Patient notes these have improved.  She has worked on her mental strength as she was worrying a lot about things.  She notes no depression.  She has to care for her handicapped son and she has worries about her vision.  She has seen cardiology and been diagnosed with PVCs and SVT.  She is on metoprolol for this.  They noted no further management needed at this time. ? ?Social History  ? ?Tobacco Use  ?Smoking Status Never  ?Smokeless Tobacco Never  ? ? ?Current Outpatient Medications on File Prior to Visit  ?Medication Sig Dispense Refill  ? aspirin EC 81 MG tablet Take 81 mg by mouth daily.    ? fluticasone (FLONASE) 50 MCG/ACT nasal spray Use 2 spray(s) in each nostril once daily 16 g 0  ? ibuprofen (ADVIL) 200 MG tablet Take 200 mg by mouth every 6 (six) hours as needed.    ? metoprolol  succinate (TOPROL-XL) 50 MG 24 hr tablet Take 1 tablet by mouth once daily 90 tablet 1  ? Multiple Vitamins-Minerals (PRESERVISION/LUTEIN) CAPS Take 2 capsules by mouth daily.    ? triamterene-hydrochlorothiazide (MAXZIDE-25) 37.5-25 MG tablet TAKE 1/2 (ONE-HALF) TABLET BY MOUTH IN THE MORNING 45 tablet 0  ? ?No current facility-administered medications on file prior to visit.  ? ? ? ?ROS see history of present illness ? ?Objective ? ?Physical Exam ?Vitals:  ? 04/08/22 0814  ?BP: 130/80  ?Pulse: 68  ?Temp: 98.5 ?F (36.9 ?C)  ?SpO2: 97%  ? ? ?BP Readings from Last 3 Encounters:  ?04/08/22 130/80  ?09/30/21 130/70  ?03/25/21 138/70  ? ?Wt Readings from Last 3 Encounters:  ?04/08/22 140 lb 6.4 oz (63.7 kg)  ?10/02/21 139 lb (63 kg)  ?09/30/21 139 lb 12.8 oz (63.4 kg)  ? ? ?Physical Exam ?Constitutional:   ?   General: She is not in acute distress. ?   Appearance: She is not diaphoretic.  ?Cardiovascular:  ?   Rate and Rhythm: Normal rate and regular rhythm.  ?   Heart sounds: Normal heart sounds.  ?Pulmonary:  ?   Effort: Pulmonary effort is normal.  ?   Breath sounds: Normal breath sounds.  ?Musculoskeletal:  ?   Right lower leg: No edema.  ?  Left lower leg: No edema.  ?Skin: ?   General: Skin is warm and dry.  ?Neurological:  ?   Mental Status: She is alert.  ? ? ? ?Assessment/Plan: Please see individual problem list. ? ?Problem List Items Addressed This Visit   ? ? Benign essential HTN  ?  Well-controlled.  She will continue metoprolol 50 mg daily and Maxzide half a tablet daily.  Check labs. ? ?  ?  ? Relevant Orders  ? Basic Metabolic Panel (BMET)  ? HLD (hyperlipidemia)  ?  Check lipid panel. ? ?  ?  ? Relevant Orders  ? Lipid panel  ? Macular degeneration  ?  She will continue to follow with ophthalmology. ? ?  ?  ? Palpitations  ?  Improved.  Likely related to SVT and PVCs.  She will monitor for any worsening symptoms. ? ?  ?  ? Prediabetes  ?  Check A1c.  I encouraged continued healthy diet.  Discussed  remaining active.  Advised to eat a snack midmorning to prevent her from getting shaky. ? ?  ?  ? Relevant Orders  ? HgB A1c  ? Stress  ?  Significant stress related to caring for her handicapped son as well as managing her own medical issues.  We will refer to care management to see what resources they have for her. ? ?  ?  ? Relevant Orders  ? AMB Referral to [place]  ? ? ?Return in about 6 months (around 10/08/2022) for Hypertension/stress. ? ?This visit occurred during the SARS-CoV-2 public health emergency.  Safety protocols were in place, including screening questions prior to the visit, additional usage of staff PPE, and extensive cleaning of exam room while observing appropriate contact time as indicated for disinfecting solutions.  ? ? ?Tommi Rumps, MD ?[place] ? ?

## 2022-04-08 NOTE — Assessment & Plan Note (Signed)
Significant stress related to caring for her handicapped son as well as managing her own medical issues.  We will refer to care management to see what resources they have for her. ?

## 2022-04-08 NOTE — Assessment & Plan Note (Signed)
She will continue to follow with ophthalmology 

## 2022-04-08 NOTE — Assessment & Plan Note (Signed)
Improved.  Likely related to SVT and PVCs.  She will monitor for any worsening symptoms. ?

## 2022-04-08 NOTE — Assessment & Plan Note (Signed)
Well-controlled.  She will continue metoprolol 50 mg daily and Maxzide half a tablet daily.  Check labs. ?

## 2022-04-08 NOTE — Chronic Care Management (AMB) (Signed)
?  Care Management  ? ?Outreach Note ? ?04/08/2022 ?Name: Jessica Reynolds MRN: [id number] DOB: 1928/11/18 ? ?Referred by: Leone Haven, MD ?Reason for referral : Care Coordination (Outreach to schedule with LCSW ) ? ? ?An unsuccessful telephone outreach was attempted today. The patient was referred to the case management team for assistance with care management and care coordination.  ? ?Follow Up Plan:  ?A HIPAA compliant phone message was left for the patient providing contact information and requesting a return call.  ?The care management team will reach out to the patient again over the next 3 days.  ?If patient returns call to provider office, please advise to call Vivian * at [phone]* ? ?Noreene Larsson, RMA ?Care Guide, Embedded Care Coordination ?[facility]  ?[place],  [address] ?Direct Dial: [phone] ?Museum/[email or web address] ?Website: [email or web address]  ? ?

## 2022-04-08 NOTE — Assessment & Plan Note (Signed)
Check lipid panel  

## 2022-04-16 ENCOUNTER — Ambulatory Visit: Payer: PPO | Admitting: *Deleted

## 2022-04-16 ENCOUNTER — Encounter: Payer: Self-pay | Admitting: *Deleted

## 2022-04-16 NOTE — Chronic Care Management (AMB) (Signed)
Assessment:  Explored patient's current care needs, with patient discussing that she has no personal care needs at this time. Patient states that she is takes care of her own IADL's and ADL's independently, she continues to drive and has a good network of suppor. However, she has a son with special needs that needs assistance with re-applying for disability  and establishing a future plan in the event that something may happen to her. No Care Plan was developed during this encounter. ?Recent life changes Jessica Reynolds: patient's son was not approved for disability benefits ? ?Interventions:Active listening / Reflection utilized  ?Emotional Support Provided ?Caregiver stress acknowledged   ?Review various resources, discussed options and provided patient information about  ?[place] and provided patient with a list of Elder Law Attorneys for asset planning ? ?Recommendation: Patient may benefit from, and is in agreement to contacting the [facility] as well as a Copywriter, advertising for assistance with re-applying for disability and developing a long term care plan for her son. ? ?Follow up Plan: Patient may benefit from and is in agreement for CCM LCSW to remain part of care team for the next 30 days.  If no needs are identified in the next 30 days, CCM LSCW will disconnect from the care team. ? ?

## 2022-04-16 NOTE — Telephone Encounter (Signed)
This encounter was created in error - please disregard.

## 2022-04-29 DIAGNOSIS — H353131 Nonexudative age-related macular degeneration, bilateral, early dry stage: Secondary | ICD-10-CM | POA: Diagnosis not present

## 2022-04-29 DIAGNOSIS — Z961 Presence of intraocular lens: Secondary | ICD-10-CM | POA: Diagnosis not present

## 2022-05-01 ENCOUNTER — Other Ambulatory Visit: Payer: Self-pay | Admitting: Family Medicine

## 2022-05-07 ENCOUNTER — Ambulatory Visit: Payer: PPO | Admitting: *Deleted

## 2022-05-07 ENCOUNTER — Telehealth: Payer: Self-pay | Admitting: *Deleted

## 2022-05-07 NOTE — Telephone Encounter (Signed)
  Care Management   Follow Up Note   05/07/2022 Name: Jessica Reynolds MRN: [id number] DOB: 01-Aug-1928   Referred by: Leone Haven, MD Reason for referral : Care Coordination   An unsuccessful telephone outreach was attempted today. The patient was referred to the case management team for assistance with care management and care coordination.   Follow Up Plan: Telephone follow up appointment with care management team member scheduled for: 05/14/22  [employer], [place] [phone]

## 2022-05-08 NOTE — Chronic Care Management (AMB) (Signed)
Assessment: Assessed patient's previous and current treatment, coping skills, support system and barriers to care.. No Care Plan was developed during this encounter.  Recent life changes Jessica Reynolds: Patient continues to report having no additional needs at this time. 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 She has been in contact with vocational rehab and has completed the paperwork for her son to ride [employer] to work. She also has the contact information for the [place] in the event that she needs assistance re-apply for disability for her son  Interventions:Solution-Focused Strategies employed:  Emotional Support Provided  Review various resources, discussed options and provided patient information about  [place]  Recommendation: Patient may benefit from, and is in agreement to contacting this social worker if any additional community resource needs arise in the future  Follow up Plan: No follow up scheduled with CCM LCSW at this time. Patient will call office if needed.

## 2022-05-19 DIAGNOSIS — I493 Ventricular premature depolarization: Secondary | ICD-10-CM | POA: Diagnosis not present

## 2022-05-19 DIAGNOSIS — R001 Bradycardia, unspecified: Secondary | ICD-10-CM | POA: Diagnosis not present

## 2022-05-19 DIAGNOSIS — I10 Essential (primary) hypertension: Secondary | ICD-10-CM | POA: Diagnosis not present

## 2022-05-19 DIAGNOSIS — I471 Supraventricular tachycardia: Secondary | ICD-10-CM | POA: Diagnosis not present

## 2022-06-04 ENCOUNTER — Ambulatory Visit
Admission: RE | Admit: 2022-06-04 | Discharge: 2022-06-04 | Disposition: A | Discharge status: Home / Self Care | Payer: PPO | Source: Ambulatory Visit | Attending: Urgent Care | Admitting: Urgent Care

## 2022-06-04 ENCOUNTER — Telehealth: Payer: Self-pay | Admitting: Family Medicine

## 2022-06-04 VITALS — BP 148/72 | HR 65 | Temp 97.8°F | Resp 18

## 2022-06-04 DIAGNOSIS — R350 Frequency of micturition: Secondary | ICD-10-CM | POA: Diagnosis not present

## 2022-06-04 DIAGNOSIS — N3001 Acute cystitis with hematuria: Secondary | ICD-10-CM | POA: Insufficient documentation

## 2022-06-04 LAB — POCT URINALYSIS DIP (MANUAL ENTRY)
Bilirubin, UA: NEGATIVE
Glucose, UA: NEGATIVE mg/dL
Ketones, POC UA: NEGATIVE mg/dL
Nitrite, UA: POSITIVE — AB
Protein Ur, POC: 100 mg/dL — AB
Spec Grav, UA: 1.02 (ref 1.010–1.025)
Urobilinogen, UA: 0.2 E.U./dL
pH, UA: 7 (ref 5.0–8.0)

## 2022-06-04 MED ORDER — CEPHALEXIN 500 MG PO CAPS: 500.0000 mg | ORAL_CAPSULE | Freq: Two times a day (BID) | ORAL | 0 refills | Status: DC

## 2022-06-04 NOTE — Telephone Encounter (Signed)
I called the patient and informed her that we did not have any openings for her to have a visit and she understood, I scheduled her at [facility] health urgent care on [address] at 7;15 pm and she understood and will go for her UTI symptoms.  Jessica Reynolds,cma

## 2022-06-04 NOTE — ED Provider Notes (Signed)
Renae Gloss   MRN: [id number] DOB: 1927-12-22  Subjective:   CHARDONNAY HOLZMANN is a 88 y.o. female presenting for 2 day history of acute onset dysuria, urinary frequency and urgency. No history of ckd. Last creatinine was 0.'82mg'$ /dL on 04/08/2022. No fever, flank pain, n/v, abdominal or pelvic pain. No medications taken for relief.   No current facility-administered medications for this encounter.  Current Outpatient Medications:    aspirin EC 81 MG tablet, Take 81 mg by mouth daily., Disp: , Rfl:    fluticasone (FLONASE) 50 MCG/ACT nasal spray, Use 2 spray(s) in each nostril once daily, Disp: 16 g, Rfl: 0   ibuprofen (ADVIL) 200 MG tablet, Take 200 mg by mouth every 6 (six) hours as needed., Disp: , Rfl:    metoprolol succinate (TOPROL-XL) 50 MG 24 hr tablet, Take 1 tablet by mouth once daily, Disp: 90 tablet, Rfl: 1   Multiple Vitamins-Minerals (PRESERVISION/LUTEIN) CAPS, Take 2 capsules by mouth daily., Disp: , Rfl:    triamterene-hydrochlorothiazide (MAXZIDE-25) 37.5-25 MG tablet, TAKE 1/2 (ONE-HALF) TABLET BY MOUTH IN THE MORNING, Disp: 45 tablet, Rfl: 0   Allergies  Allergen Reactions   Celecoxib Hives and Rash    Rash     Past Medical History:  Diagnosis Date   Arrhythmia    Arthritis    shoulder   Colon polyp    Headache    migraines   HOH (hard of hearing)    wears aids   Hypercholesterolemia    Hypertension    Osteoarthritis    UTI (lower urinary tract infection)      Past Surgical History:  Procedure Laterality Date   ABDOMINAL HYSTERECTOMY  1980   BSO, fibroids   ACHILLES TENDON SURGERY Right 1990   ACHILLES TENDON SURGERY Left 1997   CATARACT EXTRACTION W/PHACO Right 09/13/2019   Procedure: CATARACT EXTRACTION PHACO AND INTRAOCULAR LENS PLACEMENT (IOC) RIGHT 01:01.4           11.2%           6.93;  Surgeon: Birder Robson, MD;  Location: [facility];  Service: Ophthalmology;  Laterality: Right;   CATARACT EXTRACTION W/PHACO Left  10/04/2019   Procedure: CATARACT EXTRACTION PHACO AND INTRAOCULAR LENS PLACEMENT (IOC) LEFT  00:53.5  15.5%  8.31;  Surgeon: Birder Robson, MD;  Location: [facility];  Service: Ophthalmology;  Laterality: Left;   ECTOPIC PREGNANCY SURGERY  1960   VAGINAL DELIVERY     4    Family History  Problem Relation Age of Onset   Stroke Mother    Heart disease Father    Macular degeneration Sister    Diabetes Sister    Breast cancer Neg Hx     Social History   Tobacco Use   Smoking status: Never   Smokeless tobacco: Never  Vaping Use   Vaping Use: Never used  Substance Use Topics   Alcohol use: Yes    Comment: social   Drug use: No    ROS   Objective:   Vitals: BP (!) 148/72   Pulse 65   Temp 97.8 F (36.6 C)   Resp 18   SpO2 97%   Physical Exam Constitutional:      General: She is not in acute distress.    Appearance: Normal appearance. She is well-developed. She is not ill-appearing, toxic-appearing or diaphoretic.  HENT:     Head: Normocephalic and atraumatic.     Nose: Nose normal.     Mouth/Throat:  Mouth: Mucous membranes are moist.  Eyes:     General: No scleral icterus.       Right eye: No discharge.        Left eye: No discharge.     Extraocular Movements: Extraocular movements intact.  Cardiovascular:     Rate and Rhythm: Normal rate.  Pulmonary:     Effort: Pulmonary effort is normal.  Abdominal:     Tenderness: There is no right CVA tenderness or left CVA tenderness.  Skin:    General: Skin is warm and dry.  Neurological:     General: No focal deficit present.     Mental Status: She is alert and oriented to person, place, and time.  Psychiatric:        Mood and Affect: Mood normal.        Behavior: Behavior normal.     Results for orders placed or performed during the hospital encounter of 06/04/22 (from the past 24 hour(s))  POCT urinalysis dipstick     Status: Abnormal   Collection Time: 06/04/22  7:14 PM  Result Value Ref  Range   Color, UA yellow yellow   Clarity, UA cloudy (A) clear   Glucose, UA negative negative mg/dL   Bilirubin, UA negative negative   Ketones, POC UA negative negative mg/dL   Spec Grav, UA 1.020 1.010 - 1.025   Blood, UA moderate (A) negative   pH, UA 7.0 5.0 - 8.0   Protein Ur, POC =100 (A) negative mg/dL   Urobilinogen, UA 0.2 0.2 or 1.0 E.U./dL   Nitrite, UA Positive (A) Negative   Leukocytes, UA Large (3+) (A) Negative    Assessment and Plan :   PDMP not reviewed this encounter.  1. Acute cystitis with hematuria   2. Urinary frequency    Start Keflex to cover for acute cystitis, urine culture pending.  Recommended aggressive hydration, limiting urinary irritants. Counseled patient on potential for adverse effects with medications prescribed/recommended today, ER and return-to-clinic precautions discussed, patient verbalized understanding.    Jaynee Eagles, [place] 06/04/22 1925

## 2022-06-04 NOTE — ED Triage Notes (Signed)
Patient presents to Urgent Care with complaints of possible UTI. Reports dysuria and chills. Hx of UTIs. Not taking any otc meds for UTI symptoms.

## 2022-06-04 NOTE — Discharge Instructions (Signed)

## 2022-06-04 NOTE — Telephone Encounter (Signed)
Pt called in stating she has possible UTI and want medication called in. Pt states she is going to the beach on Saturday and does not want to end up in the hospital.

## 2022-06-07 LAB — URINE CULTURE: Culture: >=100000 — AB

## 2022-06-15 ENCOUNTER — Encounter: Payer: Self-pay | Admitting: Emergency Medicine

## 2022-06-15 ENCOUNTER — Ambulatory Visit
Admission: EM | Admit: 2022-06-15 | Discharge: 2022-06-15 | Disposition: A | Discharge status: Home / Self Care | Payer: PPO | Attending: Emergency Medicine | Admitting: Emergency Medicine

## 2022-06-15 ENCOUNTER — Other Ambulatory Visit: Payer: Self-pay

## 2022-06-15 DIAGNOSIS — R109 Unspecified abdominal pain: Secondary | ICD-10-CM | POA: Insufficient documentation

## 2022-06-15 DIAGNOSIS — R300 Dysuria: Secondary | ICD-10-CM | POA: Diagnosis not present

## 2022-06-15 LAB — POCT URINALYSIS DIP (MANUAL ENTRY)
Bilirubin, UA: NEGATIVE
Glucose, UA: NEGATIVE mg/dL
Ketones, POC UA: NEGATIVE mg/dL
Nitrite, UA: NEGATIVE
Protein Ur, POC: NEGATIVE mg/dL
Spec Grav, UA: 1.01 (ref 1.010–1.025)
Urobilinogen, UA: 0.2 E.U./dL
pH, UA: 6.5 (ref 5.0–8.0)

## 2022-06-15 MED ORDER — CEPHALEXIN 500 MG PO CAPS: 500.0000 mg | ORAL_CAPSULE | Freq: Two times a day (BID) | ORAL | 0 refills | Status: DC

## 2022-06-15 NOTE — ED Triage Notes (Addendum)
Patient seen 06/04/2022 for uti symptoms. Was treated  and felt she was improving.  On Friday, patient had prickly pain in right mid back.  Yesterday and today just feeling anxiety and just not her usual self.  And senses an overall feeling of not being herself.  Denies urgency, frequency

## 2022-06-15 NOTE — ED Provider Notes (Signed)
Jessica Reynolds    CSN: [id number] Arrival date & time: 06/15/22  1428      History   Chief Complaint Chief Complaint  Patient presents with   Recurrent UTI    HPI Jessica Reynolds is a 88 y.o. female.  Patient presents with dysuria and left flank pain x 2 days which has improved today.  Jessica Reynolds states Jessica Reynolds feels slight generalized malaise.  No fever, chest pain, shortness of breath, abdominal pain, hematuria, or other symptoms.  Patient was seen at this urgent care on 06/04/2022; diagnosed with acute cystitis with hematuria and urinary frequency; treated with cephalexin.  Jessica Reynolds reports her symptoms resolved with the cephalexin until Jessica Reynolds developed her current symptoms.  The history is provided by the patient and medical records.    Past Medical History:  Diagnosis Date   Arrhythmia    Arthritis    shoulder   Colon polyp    Headache    migraines   HOH (hard of hearing)    wears aids   Hypercholesterolemia    Hypertension    Osteoarthritis    UTI (lower urinary tract infection)     Patient Active Problem List   Diagnosis Date Noted   Stress 04/08/2022   Numbness of legs 09/30/2021   Neuropathy 09/30/2021   Change in bowel movement 03/25/2021   Hammertoe of right foot 07/06/2019   Cataracts, bilateral 07/06/2019   Anxiety 01/10/2019   Pedal edema 07/05/2018   Urge incontinence 12/28/2017   Macular degeneration 06/09/2017   Hearing loss 06/09/2017   Prediabetes 03/09/2017   Palpitations 03/09/2017   Benign essential HTN 08/14/2014   HLD (hyperlipidemia) 08/14/2014    Past Surgical History:  Procedure Laterality Date   ABDOMINAL HYSTERECTOMY  1980   BSO, fibroids   ACHILLES TENDON SURGERY Right 1990   ACHILLES TENDON SURGERY Left 1997   CATARACT EXTRACTION W/PHACO Right 09/13/2019   Procedure: CATARACT EXTRACTION PHACO AND INTRAOCULAR LENS PLACEMENT (IOC) RIGHT 01:01.4           11.2%           6.93;  Surgeon: Birder Robson, MD;  Location: [facility];  Service: Ophthalmology;  Laterality: Right;   CATARACT EXTRACTION W/PHACO Left 10/04/2019   Procedure: CATARACT EXTRACTION PHACO AND INTRAOCULAR LENS PLACEMENT (IOC) LEFT  00:53.5  15.5%  8.31;  Surgeon: Birder Robson, MD;  Location: [facility];  Service: Ophthalmology;  Laterality: Left;   ECTOPIC PREGNANCY SURGERY  1960   VAGINAL DELIVERY     4    OB History   No obstetric history on file.      Home Medications    Prior to Admission medications   Medication Sig Start Date End Date Taking? Authorizing Provider  cephALEXin (KEFLEX) 500 MG capsule Take 1 capsule (500 mg total) by mouth 2 (two) times daily for 5 days. 06/15/22 06/20/22 Yes Sharion Balloon, NP  aspirin EC 81 MG tablet Take 81 mg by mouth daily.    [provider]  fluticasone Asencion Islam) 50 MCG/ACT nasal spray Use 2 spray(s) in each nostril once daily 08/20/21   Leone Haven, MD  ibuprofen (ADVIL) 200 MG tablet Take 200 mg by mouth every 6 (six) hours as needed.    [provider]  metoprolol succinate (TOPROL-XL) 50 MG 24 hr tablet Take 1 tablet by mouth once daily 07/13/20   Leone Haven, MD  Multiple Vitamins-Minerals (PRESERVISION/LUTEIN) CAPS Take 2 capsules by mouth daily.    [provider]  triamterene-hydrochlorothiazide (MAXZIDE-25) 37.5-25 MG tablet TAKE 1/2 (ONE-HALF) TABLET BY MOUTH IN THE MORNING 05/02/22   Leone Haven, MD    Family History Family History  Problem Relation Age of Onset   Stroke Mother    Heart disease Father    Macular degeneration Sister    Diabetes Sister    Breast cancer Neg Hx     Social History Social History   Tobacco Use   Smoking status: Never   Smokeless tobacco: Never  Vaping Use   Vaping Use: Never used  Substance Use Topics   Alcohol use: Yes    Comment: social   Drug use: No     Allergies   Celecoxib   Review of Systems Review of Systems  Constitutional:  Negative for chills and fever.   Respiratory:  Negative for cough and shortness of breath.   Cardiovascular:  Negative for chest pain and palpitations.  Gastrointestinal:  Negative for abdominal pain, diarrhea and vomiting.  Genitourinary:  Positive for dysuria and flank pain. Negative for hematuria.  Musculoskeletal:  Negative for arthralgias and back pain.  Skin:  Negative for color change and rash.  All other systems reviewed and are negative.    Physical Exam Triage Vital Signs ED Triage Vitals  Enc Vitals Group     BP      Pulse      Resp      Temp      Temp src      SpO2      Weight      Height      Head Circumference      Peak Flow      Pain Score      Pain Loc      Pain Edu?      Excl. in [place]?    No data found.  Updated Vital Signs BP 120/63   Pulse 72   Temp 97.8 F (36.6 C)   Resp 18   SpO2 97%   Visual Acuity Right Eye Distance:   Left Eye Distance:   Bilateral Distance:    Right Eye Near:   Left Eye Near:    Bilateral Near:     Physical Exam Vitals and nursing note reviewed.  Constitutional:      General: Jessica Reynolds is not in acute distress.    Appearance: Jessica Reynolds is well-developed. Jessica Reynolds is not ill-appearing.  HENT:     Mouth/Throat:     Mouth: Mucous membranes are moist.  Cardiovascular:     Rate and Rhythm: Normal rate and regular rhythm.     Heart sounds: Normal heart sounds.  Pulmonary:     Effort: Pulmonary effort is normal. No respiratory distress.     Breath sounds: Normal breath sounds.  Abdominal:     General: Bowel sounds are normal.     Palpations: Abdomen is soft.     Tenderness: There is no abdominal tenderness. There is no right CVA tenderness, left CVA tenderness, guarding or rebound.  Musculoskeletal:     Cervical back: Neck supple.  Skin:    General: Skin is warm and dry.     Findings: No rash.  Neurological:     Mental Status: Jessica Reynolds is alert.  Psychiatric:        Mood and Affect: Mood normal.        Behavior: Behavior normal.      UC Treatments /  Results  Labs (all labs ordered are listed, but only abnormal results  are displayed) Labs Reviewed  POCT URINALYSIS DIP (MANUAL ENTRY) - Abnormal; Notable for the following components:      Result Value   Clarity, UA cloudy (*)    Blood, UA trace-lysed (*)    Leukocytes, UA Large (3+) (*)    All other components within normal limits  URINE CULTURE    EKG   Radiology No results found.  Procedures Procedures (including critical care time)  Medications Ordered in UC Medications - No data to display  Initial Impression / Assessment and Plan / UC Course  I have reviewed the triage vital signs and the nursing notes.  Pertinent labs & imaging results that were available during my care of the patient were reviewed by me and considered in my medical decision making (see chart for details).  Dysuria, Right flank pain.  Patient is well-appearing and her exam is reassuring.  Treating with Keflex. Urine culture pending. Discussed with patient that we will call her if the urine culture shows the need to change or discontinue the antibiotic. Instructed her to follow-up with her PCP tomorrow.  ED precautions discussed.  Patient agrees to plan of care.      Final Clinical Impressions(s) / UC Diagnoses   Final diagnoses:  Dysuria  Flank pain     Discharge Instructions      Take the antibiotic as directed.  The urine culture is pending.  We will call you if it shows the need to change or discontinue your antibiotic.    Schedule an appointment with your primary care provider tomorrow.  Go to the emergency department if you have worsening symptoms.     ED Prescriptions     Medication Sig Dispense Auth. Provider   cephALEXin (KEFLEX) 500 MG capsule Take 1 capsule (500 mg total) by mouth 2 (two) times daily for 5 days. 10 capsule Sharion Balloon, NP      PDMP not reviewed this encounter.   Sharion Balloon, NP 06/15/22 [phone]

## 2022-06-15 NOTE — Discharge Instructions (Addendum)
Take the antibiotic as directed.  The urine culture is pending.  We will call you if it shows the need to change or discontinue your antibiotic.    Schedule an appointment with your primary care provider tomorrow.  Go to the emergency department if you have worsening symptoms.

## 2022-06-16 ENCOUNTER — Encounter: Payer: Self-pay | Admitting: Family Medicine

## 2022-06-16 ENCOUNTER — Telehealth: Payer: Self-pay | Admitting: Family Medicine

## 2022-06-16 ENCOUNTER — Ambulatory Visit (INDEPENDENT_AMBULATORY_CARE_PROVIDER_SITE_OTHER): Payer: PPO | Admitting: Family Medicine

## 2022-06-16 VITALS — BP 116/72 | HR 57 | Temp 97.3°F | Ht 64.0 in | Wt 143.4 lb

## 2022-06-16 DIAGNOSIS — N3001 Acute cystitis with hematuria: Secondary | ICD-10-CM | POA: Diagnosis not present

## 2022-06-16 DIAGNOSIS — N390 Urinary tract infection, site not specified: Secondary | ICD-10-CM | POA: Insufficient documentation

## 2022-06-16 MED ORDER — CEFADROXIL 500 MG PO CAPS: 500.0000 mg | ORAL_CAPSULE | Freq: Two times a day (BID) | ORAL | 0 refills | Status: DC

## 2022-06-16 NOTE — Patient Instructions (Signed)
Nice to see you. We are going to change her antibiotic to cefadroxil.  You will take this 1 tablet twice daily.  We will contact you with your urine culture results. If you have any worsening symptoms please let us know.

## 2022-06-16 NOTE — Telephone Encounter (Signed)
Pt called demanding to see her provider because she went to urgent care for a UTI that she has had since June 22. Urgent care stated she needs to see her provider for a follow-up. I told the pt her provider did not have any appointments today but I can put her with someone else at our other locations but she stated she need to see her provider. I tried to make an appointment for pt several times but pt kept saying she needs to see her provider for her UTI. I kept trying to make an appointment for her but she did not want one and hung up.

## 2022-06-16 NOTE — Addendum Note (Signed)
Addended by: Leeanne Rio on: 06/16/2022 02:57 PM   Modules accepted: Orders

## 2022-06-16 NOTE — Assessment & Plan Note (Signed)
Patient with UTI symptoms and positive urine culture.  She was treated with inadequate frequency of Keflex previously.  We will switch her over to cefadroxil so that she can do twice daily dosing instead of 4 times daily.  She will take cefadroxil 500 mg twice daily for 7 days.  We will send urine for microscopy as the urgent care did not do that.  I will monitor for her urine culture results and contact her if we need to change antibiotics.  If she has any worsening symptoms or her symptoms do not resolve with the cefadroxil she will let us know.

## 2022-06-16 NOTE — Telephone Encounter (Signed)
Pt is scheduled for today at 2:15 with Dr. Caryl Bis.

## 2022-06-16 NOTE — Progress Notes (Signed)
  Tommi Rumps, MD Phone: [phone]  Jessica Reynolds is a 88 y.o. female who presents today for same day visit.  UTI: Patient has onset of symptoms for UTI initially back in June.  She was treated with Keflex and had recurrence of symptoms starting last Friday.  She was evaluated at urgent care yesterday and her urine was retested revealing Proteus 30,000 colonies.  She was started back on Keflex 500 mg twice daily for 5 days.  She has taken 2 doses and does feel some improvement. Dysuria- yes  Frequency- yes   Urgency- yes   Hematuria- no   Abd pain- no   Vaginal d/c- no   Social History   Tobacco Use  Smoking Status Never  Smokeless Tobacco Never    Current Outpatient Medications on File Prior to Visit  Medication Sig Dispense Refill   aspirin EC 81 MG tablet Take 81 mg by mouth daily.     fluticasone (FLONASE) 50 MCG/ACT nasal spray Use 2 spray(s) in each nostril once daily 16 g 0   ibuprofen (ADVIL) 200 MG tablet Take 200 mg by mouth every 6 (six) hours as needed.     metoprolol succinate (TOPROL-XL) 50 MG 24 hr tablet Take 1 tablet by mouth once daily 90 tablet 1   Multiple Vitamins-Minerals (PRESERVISION/LUTEIN) CAPS Take 2 capsules by mouth daily.     triamterene-hydrochlorothiazide (MAXZIDE-25) 37.5-25 MG tablet TAKE 1/2 (ONE-HALF) TABLET BY MOUTH IN THE MORNING 45 tablet 0   No current facility-administered medications on file prior to visit.     ROS see history of present illness  Objective  Physical Exam Vitals:   06/16/22 1417  BP: 116/72  Pulse: (!) 57  Temp: (!) 97.3 F (36.3 C)  SpO2: 97%    BP Readings from Last 3 Encounters:  06/16/22 116/72  06/15/22 120/63  06/04/22 (!) 148/72   Wt Readings from Last 3 Encounters:  06/16/22 143 lb 6.4 oz (65 kg)  04/08/22 140 lb 6.4 oz (63.7 kg)  10/02/21 139 lb (63 kg)    Physical Exam Abdominal:     General: Bowel sounds are normal. There is no distension.     Palpations: Abdomen is soft.      Tenderness: There is abdominal tenderness (Mild suprapubic tenderness).      Assessment/Plan: Please see individual problem list.  Problem List Items Addressed This Visit     UTI (urinary tract infection) - Primary    Patient with UTI symptoms and positive urine culture.  She was treated with inadequate frequency of Keflex previously.  We will switch her over to cefadroxil so that she can do twice daily dosing instead of 4 times daily.  She will take cefadroxil 500 mg twice daily for 7 days.  We will send urine for microscopy as the urgent care did not do that.  I will monitor for her urine culture results and contact her if we need to change antibiotics.  If she has any worsening symptoms or her symptoms do not resolve with the cefadroxil she will let us know.      Relevant Medications   cefadroxil (DURICEF) 500 MG capsule   Other Relevant Orders   Urine Microscopic    Return if symptoms worsen or fail to improve.   Tommi Rumps, MD [place]

## 2022-06-17 LAB — URINE CULTURE: Culture: 30000 — AB

## 2022-06-17 LAB — URINALYSIS, MICROSCOPIC ONLY
Bacteria, UA: NONE SEEN /HPF
Hyaline Cast: NONE SEEN /LPF
RBC / HPF: NONE SEEN /HPF (ref 0–2)
Squamous Epithelial / HPF: NONE SEEN /HPF (ref <=5)

## 2022-06-17 LAB — EXTRA URINE SPECIMEN

## 2022-06-18 ENCOUNTER — Telehealth: Payer: Self-pay | Admitting: Family Medicine

## 2022-06-18 NOTE — Telephone Encounter (Signed)
I called and LVM for patient to call back.  Jessica Reynolds,cma

## 2022-06-18 NOTE — Telephone Encounter (Signed)
Please let the patient know that her urine infection should be responsive to the cefadroxil. If her symptoms do not resolve with this antibiotic she should let us know.

## 2022-06-18 NOTE — Telephone Encounter (Signed)
-----   Message from Leone Haven, MD sent at 06/16/2022  2:32 PM EDT ----- Regarding: Urine culture Check urine culture results.

## 2022-06-19 NOTE — Telephone Encounter (Signed)
I called and spoke with the patient and she statd that the medication is working very well,she is much better.  Jessica Reynolds,cma

## 2022-06-20 DIAGNOSIS — H353131 Nonexudative age-related macular degeneration, bilateral, early dry stage: Secondary | ICD-10-CM | POA: Diagnosis not present

## 2022-06-21 ENCOUNTER — Other Ambulatory Visit: Payer: Self-pay | Admitting: Family Medicine

## 2022-07-29 DIAGNOSIS — H353114 Nonexudative age-related macular degeneration, right eye, advanced atrophic with subfoveal involvement: Secondary | ICD-10-CM | POA: Diagnosis not present

## 2022-09-19 ENCOUNTER — Encounter: Payer: Self-pay | Admitting: Family Medicine

## 2022-09-19 ENCOUNTER — Ambulatory Visit (INDEPENDENT_AMBULATORY_CARE_PROVIDER_SITE_OTHER): Payer: PPO | Admitting: Family Medicine

## 2022-09-19 VITALS — BP 110/70 | HR 56 | Temp 98.1°F | Ht 64.0 in | Wt 143.0 lb

## 2022-09-19 DIAGNOSIS — F439 Reaction to severe stress, unspecified: Secondary | ICD-10-CM | POA: Diagnosis not present

## 2022-09-19 DIAGNOSIS — I10 Essential (primary) hypertension: Secondary | ICD-10-CM

## 2022-09-19 DIAGNOSIS — G629 Polyneuropathy, unspecified: Secondary | ICD-10-CM

## 2022-09-19 LAB — COMPREHENSIVE METABOLIC PANEL
ALT: 11 U/L (ref 0–35)
AST: 17 U/L (ref 0–37)
Albumin: 4 g/dL (ref 3.5–5.2)
Alkaline Phosphatase: 53 U/L (ref 39–117)
BUN: 16 mg/dL (ref 6–23)
CO2: 28 mEq/L (ref 19–32)
Calcium: 9.3 mg/dL (ref 8.4–10.5)
Chloride: 101 mEq/L (ref 96–112)
Creatinine, Ser: 0.87 mg/dL (ref 0.40–1.20)
GFR: 57.86 mL/min — ABNORMAL LOW (ref >60.00)
Glucose, Bld: 103 mg/dL — ABNORMAL HIGH (ref 70–99)
Potassium: 3.8 mEq/L (ref 3.5–5.1)
Sodium: 136 mEq/L (ref 135–145)
Total Bilirubin: 0.4 mg/dL (ref 0.2–1.2)
Total Protein: 6.4 g/dL (ref 6.0–8.3)

## 2022-09-19 LAB — VITAMIN B12: Vitamin B-12: 725 pg/mL (ref 211–911)

## 2022-09-19 LAB — HEMOGLOBIN A1C: Hgb A1c MFr Bld: 6 % (ref 4.6–6.5)

## 2022-09-19 NOTE — Assessment & Plan Note (Signed)
Significant stress related to caring for her handicapped son no longer has a job or insurance.  We will refer to care management to see what resources they have for her.

## 2022-09-19 NOTE — Assessment & Plan Note (Addendum)
Adequately controlled.  She will continue metoprolol 50 mg daily and Maxzide half a tablet daily.

## 2022-09-19 NOTE — Patient Instructions (Signed)
Nice to see you. We will get lab work today and contact you with the results. Someone should contact you to help with any resources they may be able to provide

## 2022-09-19 NOTE — Assessment & Plan Note (Signed)
Patient has neuropathy.  We will check an A1c and a B12.

## 2022-09-19 NOTE — Progress Notes (Signed)
Eric Sonnenberg, MD Phone: [phone]  Jessica Reynolds is a 88 y.o. female who presents today for f/u.  HYPERTENSION Disease Monitoring Home BP Monitoring not checking at home Chest pain- no    Dyspnea- no Medications Compliance-  metoprolol, triamterene/HCTZ.   Edema- no BMET    Component Value Date/Time   NA 140 04/08/2022 0831   K 3.7 04/08/2022 0831   CL 105 04/08/2022 0831   CO2 27 04/08/2022 0831   GLUCOSE 91 04/08/2022 0831   BUN 17 04/08/2022 0831   CREATININE 0.82 04/08/2022 0831   CALCIUM 9.3 04/08/2022 0831   GFRNONAA 51 (L) 01/09/2016 1740   GFRAA 59 (L) 01/09/2016 1740   Stress: Patient notes her son lost his job recently and does not have insurance.  He is handicapped and she is his primary caregiver.  She notes some anxiety related to this.  No depression.  Neuropathy: Continues to have issues with this with tingling in her toes and balls of her feet.  She has subsequently developed some numbness in this area as well.  She does have prediabetes.  Social History   Tobacco Use  Smoking Status Never  Smokeless Tobacco Never    Current Outpatient Medications on File Prior to Visit  Medication Sig Dispense Refill   aspirin EC 81 MG tablet Take 81 mg by mouth daily.     cefadroxil (DURICEF) 500 MG capsule Take 1 capsule (500 mg total) by mouth 2 (two) times daily. 14 capsule 0   fluticasone (FLONASE) 50 MCG/ACT nasal spray Use 2 spray(s) in each nostril once daily 16 g 0   ibuprofen (ADVIL) 200 MG tablet Take 200 mg by mouth every 6 (six) hours as needed.     metoprolol succinate (TOPROL-XL) 50 MG 24 hr tablet Take 1 tablet by mouth once daily 90 tablet 1   Multiple Vitamins-Minerals (PRESERVISION/LUTEIN) CAPS Take 2 capsules by mouth daily.     triamterene-hydrochlorothiazide (MAXZIDE-25) 37.5-25 MG tablet TAKE 1/2 (ONE-HALF) TABLET BY MOUTH IN THE MORNING 45 tablet 0   No current facility-administered medications on file prior to visit.     ROS see  history of present illness  Objective  Physical Exam Vitals:   09/19/22 1137  BP: 110/70  Pulse: (!) 56  Temp: 98.1 F (36.7 C)  SpO2: 97%    BP Readings from Last 3 Encounters:  09/19/22 110/70  06/16/22 116/72  06/15/22 120/63   Wt Readings from Last 3 Encounters:  09/19/22 143 lb (64.9 kg)  06/16/22 143 lb 6.4 oz (65 kg)  04/08/22 140 lb 6.4 oz (63.7 kg)    Physical Exam Constitutional:      General: She is not in acute distress.    Appearance: She is not diaphoretic.  Cardiovascular:     Rate and Rhythm: Normal rate and regular rhythm.     Heart sounds: Normal heart sounds.  Pulmonary:     Effort: Pulmonary effort is normal.     Breath sounds: Normal breath sounds.  Skin:    General: Skin is warm and dry.  Neurological:     Mental Status: She is alert.      Assessment/Plan: Please see individual problem list.  Problem List Items Addressed This Visit     Benign essential HTN (Chronic)    Adequately controlled.  She will continue metoprolol 50 mg daily and Maxzide half a tablet daily.      Relevant Orders   Comp Met (CMET)   Neuropathy (Chronic)    Patient   has neuropathy.  We will check an A1c and a B12.      Relevant Orders   B12   HgB A1c   Stress - Primary    Significant stress related to caring for her handicapped son no longer has a job or insurance.  We will refer to care management to see what resources they have for her.      Relevant Orders   AMB Referral to Community Care Coordinaton    Return in about 6 months (around 03/21/2023) for Hypertension.   Eric Sonnenberg, MD [facility] [place]  

## 2022-09-23 ENCOUNTER — Telehealth: Payer: Self-pay | Admitting: *Deleted

## 2022-09-23 NOTE — Chronic Care Management (AMB) (Signed)
  Care Coordination  Outreach Note  09/23/2022 Name: Jessica Reynolds MRN: [id number] DOB: 1928/07/27   Care Coordination Outreach Attempts: An unsuccessful telephone outreach was attempted today to offer the patient information about available care coordination services as a benefit of their health plan.   Received referral  Follow Up Plan:  Additional outreach attempts will be made to offer the patient care coordination information and services.   Encounter Outcome:  No Answer  Julian Hy, [place] Direct Dial: [phone]

## 2022-09-29 NOTE — Chronic Care Management (AMB) (Signed)
  Care Coordination   Note   09/29/2022 Name: Jessica Reynolds MRN: [id number] DOB: 1928/01/12  Jessica Reynolds is a 88 y.o. year old female who sees Caryl Bis, Angela Adam, MD for primary care. I reached out to Lorenso Quarry by phone today to offer care coordination services.  Ms. Mimbs was given information about Care Coordination services today including:   The Care Coordination services include support from the care team which includes your Nurse Coordinator, Clinical Social Worker, or Pharmacist.  The Care Coordination team is here to help remove barriers to the health concerns and goals most important to you. Care Coordination services are voluntary, and the patient may decline or stop services at any time by request to their care team member.   Care Coordination Consent Status: Patient agreed to services and verbal consent obtained.   Follow up plan:  Telephone appointment with care coordination team member scheduled for:  10/03/2022  Encounter Outcome:  Pt. Scheduled from referral   Julian Hy, [place] Direct Dial: [phone]

## 2022-09-29 NOTE — Chronic Care Management (AMB) (Signed)
  Care Coordination  Outreach Note  09/29/2022 Name: Jessica Reynolds MRN: [id number] DOB: 1928/05/17   Care Coordination Outreach Attempts: A second unsuccessful outreach was attempted today to offer the patient with information about available care coordination services as a benefit of their health plan.     Follow Up Plan:  Additional outreach attempts will be made to offer the patient care coordination information and services.   Encounter Outcome:  No Answer  Julian Hy, [place] Direct Dial: [phone]

## 2022-10-01 ENCOUNTER — Other Ambulatory Visit: Payer: Self-pay | Admitting: Family Medicine

## 2022-10-03 ENCOUNTER — Ambulatory Visit: Payer: Self-pay | Admitting: *Deleted

## 2022-10-03 ENCOUNTER — Telehealth: Payer: Self-pay | Admitting: *Deleted

## 2022-10-03 ENCOUNTER — Encounter: Payer: Self-pay | Admitting: *Deleted

## 2022-10-03 NOTE — Patient Outreach (Signed)
  Care Coordination   10/03/2022 Name: Jessica Reynolds MRN: [id number] DOB: Feb 07, 1928   Care Coordination Outreach Attempts:  An unsuccessful telephone outreach was attempted today to offer the patient information about available care coordination services as a benefit of their health plan.   Follow Up Plan:  Additional outreach attempts will be made to offer the patient care coordination information and services.   Encounter Outcome:  No Answer  Care Coordination Interventions Activated:  No   Care Coordination Interventions:  No, not indicated    Neeva Trew, [place] Worker  [facility] Care Management [phone]

## 2022-10-06 ENCOUNTER — Ambulatory Visit (INDEPENDENT_AMBULATORY_CARE_PROVIDER_SITE_OTHER): Payer: PPO

## 2022-10-06 VITALS — BP 126/76 | HR 60 | Temp 97.7°F | Resp 16 | Ht 64.0 in | Wt 145.6 lb

## 2022-10-06 DIAGNOSIS — Z0000 Encounter for general adult medical examination without abnormal findings: Secondary | ICD-10-CM

## 2022-10-06 NOTE — Patient Instructions (Addendum)
[name] , Thank you for taking time to come for your Medicare Wellness Visit. I appreciate your ongoing commitment to your health goals. Please review the following plan we discussed and let me know if I can assist you in the future.   These are the goals we discussed:  Goals       Patient Stated     Follow up with Primary Care Provider (pt-stated)      Follow up as needed Wear hearing aids more often for better quality of life Increase protein in diet        This is a list of the screening recommended for you and due dates:  Health Maintenance  Topic Date Due   Zoster (Shingles) Vaccine (1 of 2) 01/06/2023*   Flu Shot  03/15/2023*   COVID-19 Vaccine (6 - Pfizer risk series) 11/09/2022   Tetanus Vaccine  01/18/2023   Pneumonia Vaccine  Completed   DEXA scan (bone density measurement)  Completed   HPV Vaccine  Aged Out  *Topic was postponed. The date shown is not the original due date.    Advanced directives: End of life planning; Advance aging; Advanced directives discussed.  Copy of current HCPOA/Living Will requested.    Conditions/risks identified: none new  Next appointment: Follow up in one year for your annual wellness visit    Preventive Care 65 Years and Older, Female Preventive care refers to lifestyle choices and visits with your health care provider that can promote health and wellness. What does preventive care include? A yearly physical exam. This is also called an annual well check. Dental exams once or twice a year. Routine eye exams. Ask your health care provider how often you should have your eyes checked. Personal lifestyle choices, including: Daily care of your teeth and gums. Regular physical activity. Eating a healthy diet. Avoiding tobacco and drug use. Limiting alcohol use. Practicing safe sex. Taking low-dose aspirin every day. Taking vitamin and mineral supplements as recommended by your health care provider. What happens during an  annual well check? The services and screenings done by your health care provider during your annual well check will depend on your age, overall health, lifestyle risk factors, and family history of disease. Counseling  Your health care provider may ask you questions about your: Alcohol use. Tobacco use. Drug use. Emotional well-being. Home and relationship well-being. Sexual activity. Eating habits. History of falls. Memory and ability to understand (cognition). Work and work Statistician. Reproductive health. Screening  You may have the following tests or measurements: Height, weight, and BMI. Blood pressure. Lipid and cholesterol levels. These may be checked every 5 years, or more frequently if you are over 68 years old. Skin check. Lung cancer screening. You may have this screening every year starting at age 72 if you have a 30-pack-year history of smoking and currently smoke or have quit within the past 15 years. Fecal occult blood test (FOBT) of the stool. You may have this test every year starting at age 44. Flexible sigmoidoscopy or colonoscopy. You may have a sigmoidoscopy every 5 years or a colonoscopy every 10 years starting at age 63. Hepatitis C blood test. Hepatitis B blood test. Sexually transmitted disease (STD) testing. Diabetes screening. This is done by checking your blood sugar (glucose) after you have not eaten for a while (fasting). You may have this done every 1-3 years. Bone density scan. This is done to screen for osteoporosis. You may have this done starting at age 3. Mammogram. This  may be done every 1-2 years. Talk to your health care provider about how often you should have regular mammograms. Talk with your health care provider about your test results, treatment options, and if necessary, the need for more tests. Vaccines  Your health care provider may recommend certain vaccines, such as: Influenza vaccine. This is recommended every year. Tetanus,  diphtheria, and acellular pertussis (Tdap, Td) vaccine. You may need a Td booster every 10 years. Zoster vaccine. You may need this after age 75. Pneumococcal 13-valent conjugate (PCV13) vaccine. One dose is recommended after age 104. Pneumococcal polysaccharide (PPSV23) vaccine. One dose is recommended after age 47. Talk to your health care provider about which screenings and vaccines you need and how often you need them. This information is not intended to replace advice given to you by your health care provider. Make sure you discuss any questions you have with your health care provider. Document Released: 12/28/2015 Document Revised: 08/20/2016 Document Reviewed: 10/02/2015 Elsevier Interactive Patient Education  2017 [place] Prevention in the Home Falls can cause injuries. They can happen to people of all ages. There are many things you can do to make your home safe and to help prevent falls. What can I do on the outside of my home? Regularly fix the edges of walkways and driveways and fix any cracks. Remove anything that might make you trip as you walk through a door, such as a raised step or threshold. Trim any bushes or trees on the path to your home. Use bright outdoor lighting. Clear any walking paths of anything that might make someone trip, such as rocks or tools. Regularly check to see if handrails are loose or broken. Make sure that both sides of any steps have handrails. Any raised decks and porches should have guardrails on the edges. Have any leaves, snow, or ice cleared regularly. Use sand or salt on walking paths during winter. Clean up any spills in your garage right away. This includes oil or grease spills. What can I do in the bathroom? Use night lights. Install grab bars by the toilet and in the tub and shower. Do not use towel bars as grab bars. Use non-skid mats or decals in the tub or shower. If you need to sit down in the shower, use a plastic,  non-slip stool. Keep the floor dry. Clean up any water that spills on the floor as soon as it happens. Remove soap buildup in the tub or shower regularly. Attach bath mats securely with double-sided non-slip rug tape. Do not have throw rugs and other things on the floor that can make you trip. What can I do in the bedroom? Use night lights. Make sure that you have a light by your bed that is easy to reach. Do not use any sheets or blankets that are too big for your bed. They should not hang down onto the floor. Have a firm chair that has side arms. You can use this for support while you get dressed. Do not have throw rugs and other things on the floor that can make you trip. What can I do in the kitchen? Clean up any spills right away. Avoid walking on wet floors. Keep items that you use a lot in easy-to-reach places. If you need to reach something above you, use a strong step stool that has a grab bar. Keep electrical cords out of the way. Do not use floor polish or wax that makes floors slippery. If you must  use wax, use non-skid floor wax. Do not have throw rugs and other things on the floor that can make you trip. What can I do with my stairs? Do not leave any items on the stairs. Make sure that there are handrails on both sides of the stairs and use them. Fix handrails that are broken or loose. Make sure that handrails are as long as the stairways. Check any carpeting to make sure that it is firmly attached to the stairs. Fix any carpet that is loose or worn. Avoid having throw rugs at the top or bottom of the stairs. If you do have throw rugs, attach them to the floor with carpet tape. Make sure that you have a light switch at the top of the stairs and the bottom of the stairs. If you do not have them, ask someone to add them for you. What else can I do to help prevent falls? Wear shoes that: Do not have high heels. Have rubber bottoms. Are comfortable and fit you well. Are closed  at the toe. Do not wear sandals. If you use a stepladder: Make sure that it is fully opened. Do not climb a closed stepladder. Make sure that both sides of the stepladder are locked into place. Ask someone to hold it for you, if possible. Clearly mark and make sure that you can see: Any grab bars or handrails. First and last steps. Where the edge of each step is. Use tools that help you move around (mobility aids) if they are needed. These include: Canes. Walkers. Scooters. Crutches. Turn on the lights when you go into a dark area. Replace any light bulbs as soon as they burn out. Set up your furniture so you have a clear path. Avoid moving your furniture around. If any of your floors are uneven, fix them. If there are any pets around you, be aware of where they are. Review your medicines with your doctor. Some medicines can make you feel dizzy. This can increase your chance of falling. Ask your doctor what other things that you can do to help prevent falls. This information is not intended to replace advice given to you by your health care provider. Make sure you discuss any questions you have with your health care provider. Document Released: 09/27/2009 Document Revised: 05/08/2016 Document Reviewed: 01/05/2015 Elsevier Interactive Patient Education  2017 [employer].

## 2022-10-06 NOTE — Progress Notes (Signed)
Subjective:   Jessica Reynolds is a 88 y.o. female who presents for Medicare Annual/Subsequent preventive examination.  Review of Systems    No ROS.  Medicare Wellness   Cardiac Risk Factors include: advanced age (>54mn, >>44women)     Objective:    Today's Vitals   10/06/22 1457  BP: 126/76  Pulse: 60  Resp: 16  Temp: 97.7 F (36.5 C)  SpO2: 98%  Weight: 145 lb 9.6 oz (66 kg)  Height: '5\' 4"'$  (1.626 m)   Body mass index is 24.99 kg/m.     10/06/2022    3:13 PM 10/02/2021    2:19 PM 07/09/2020   12:56 PM 10/04/2019    9:15 AM 07/07/2019   12:53 PM 07/05/2018   10:07 AM 01/09/2016    5:33 PM  Advanced Directives  Does Patient Have a Medical Advance Directive? Yes Yes Yes Yes Yes Yes No;Yes  Type of Advance Directive Living will;Healthcare Power of [place] will [place] will [place]  Does patient want to make changes to medical advance directive? No - Patient declined No - Patient declined No - Patient declined No - Patient declined No - Patient declined No - Patient declined   Copy of HHesterin Chart? No - copy requested No - copy requested No - copy requested No - copy requested No - copy requested No - copy requested     Current Medications (verified) Outpatient Encounter Medications as of 10/06/2022  Medication Sig   aspirin EC 81 MG tablet Take 81 mg by mouth daily.   cefadroxil (DURICEF) 500 MG capsule Take 1 capsule (500 mg total) by mouth 2 (two) times daily.   fluticasone (FLONASE) 50 MCG/ACT nasal spray Use 2 spray(s) in each nostril once daily   ibuprofen (ADVIL) 200 MG tablet Take 200 mg by mouth every 6 (six) hours as needed.   metoprolol succinate (TOPROL-XL) 50 MG 24 hr tablet Take 1 tablet by mouth once daily   Multiple Vitamins-Minerals (PRESERVISION/LUTEIN) CAPS Take 2  capsules by mouth daily.   triamterene-hydrochlorothiazide (MAXZIDE-25) 37.5-25 MG tablet TAKE 1/2 (ONE-HALF) TABLET BY MOUTH IN THE MORNING   No facility-administered encounter medications on file as of 10/06/2022.    Allergies (verified) Celecoxib   History: Past Medical History:  Diagnosis Date   Arrhythmia    Arthritis    shoulder   Colon polyp    Headache    migraines   HOH (hard of hearing)    wears aids   Hypercholesterolemia    Hypertension    Osteoarthritis    UTI (lower urinary tract infection)    Past Surgical History:  Procedure Laterality Date   ABDOMINAL HYSTERECTOMY  1980   BSO, fibroids   ACHILLES TENDON SURGERY Right 1990   ACHILLES TENDON SURGERY Left 1997   CATARACT EXTRACTION W/PHACO Right 09/13/2019   Procedure: CATARACT EXTRACTION PHACO AND INTRAOCULAR LENS PLACEMENT (IOC) RIGHT 01:01.4           11.2%           6.93;  Surgeon: PBirder Robson MD;  [facility]  Service: Ophthalmology;  Laterality: Right;   CATARACT EXTRACTION W/PHACO Left 10/04/2019   Procedure: CATARACT EXTRACTION PHACO AND INTRAOCULAR LENS PLACEMENT (IOC) LEFT  00:53.5  15.5%  8.31;  Surgeon: PBirder Robson MD;  [facility]  Service: Ophthalmology;  Laterality:  Left;   ECTOPIC PREGNANCY SURGERY  1960   VAGINAL DELIVERY     4   Family History  Problem Relation Age of Onset   Stroke Mother    Heart disease Father    Macular degeneration Sister    Diabetes Sister    Breast cancer Neg Hx    Social History   Socioeconomic History   Marital status: Widowed    Spouse name: Not on file   Number of children: Not on file   Years of education: Not on file   Highest education level: Not on file  Occupational History   Not on file  Tobacco Use   Smoking status: Never   Smokeless tobacco: Never  Vaping Use   Vaping Use: Never used  Substance and Sexual Activity   Alcohol use: Yes    Comment: social   Drug use: No   Sexual activity:  Not on file  Other Topics Concern   Not on file  Social History Narrative   Born in [place], [place].      Lives in [place] with son. Husband passed away 27-Feb-2012. No pets.      Work - retired for [employer] - regular      Exercise - tennis,  3-5 days per week   Social Determinants of Health   Financial Resource Strain: Low Risk  (10/06/2022)   Overall Financial Resource Strain (CARDIA)    Difficulty of Paying Living Expenses: Not hard at all  Food Insecurity: No Food Insecurity (10/06/2022)   Hunger Vital Sign    Worried About Running Out of Food in the Last Year: Never true    [place] in the Last Year: Never true  Transportation Needs: No Transportation Needs (10/06/2022)   PRAPARE - Hydrologist (Medical): No    Lack of Transportation (Non-Medical): No  Physical Activity: Unknown (10/06/2022)   Exercise Vital Sign    Days of Exercise per Week: 0 days    Minutes of Exercise per Session: Not on file  Stress: No Stress Concern Present (10/06/2022)   [place]    Feeling of Stress : Only a little  Social Connections: Moderately Integrated (10/06/2022)   Social Connection and Isolation Panel [NHANES]    Frequency of Communication with Friends and Family: More than three times a week    Frequency of Social Gatherings with Friends and Family: More than three times a week    Attends Religious Services: More than 4 times per year    Active Member of [employer] or Organizations: Yes    Attends Archivist Meetings: 1 to 4 times per year    Marital Status: Widowed    Tobacco Counseling Counseling given: Not Answered   Clinical Intake:  Pre-visit preparation completed: Yes        Diabetes: No  How often do you need to have someone help you when you read instructions, pamphlets, or other written materials from your doctor or pharmacy?: 1 -  Never    Interpreter Needed?: No      Activities of Daily Living    10/06/2022    3:17 PM  In your present state of health, do you have any difficulty performing the following activities:  Hearing? 1  Comment Hearing aids  Vision? 0  Difficulty concentrating or making decisions? 0  Walking or climbing stairs? 0  Dressing or bathing?  0  Doing errands, shopping? 0  Preparing Food and eating ? N  Using the Toilet? N  In the past six months, have you accidently leaked urine? Y  Comment Managed with daily pad  Do you have problems with loss of bowel control? N  Managing your Medications? N  Managing your Finances? N  Housekeeping or managing your Housekeeping? N    Patient Care Team: Leone Haven, MD as PCP - General (Family Medicine) Jackolyn Confer, MD (Internal Medicine) Vern Claude, LCSW as Social Worker  Indicate any recent Medical Services you may have received from other than [facility] providers in the past year (date may be approximate).     Assessment:   This is a routine wellness examination for Maahi.  Hearing/Vision screen Hearing Screening - Comments:: Hearing aid, bilateral   Vision Screening - Comments:: Followed by [facility] (Dr. Jeni Salles) Wears corrective lenses  Macular degeneration  They have regular follow up with the ophthalmologist  Dietary issues and exercise activities discussed: Current Exercise Habits: The patient has a physically strenuous job, but has no regular exercise apart from work. Boost drink daily 3 meals daily, regular diet Good water intake   Goals Addressed   None    Depression Screen    10/06/2022    3:02 PM 09/19/2022   11:39 AM 06/16/2022    2:18 PM 04/16/2022    2:05 PM 04/08/2022    8:16 AM 10/02/2021    2:17 PM 09/30/2021    9:23 AM  PHQ 2/9 Scores  PHQ - 2 Score 0 0 0 0 0 0 0    Fall Risk    10/06/2022    3:16 PM 09/19/2022   11:38 AM 06/16/2022    2:18 PM 04/08/2022    8:16 AM  10/02/2021    2:21 PM  [place] in the past year? 0 0 0 0 0  Number falls in past yr: 0 0 0 0 0  Injury with Fall? 0 0 0 0   Risk for fall due to : No Fall Risks No Fall Risks No Fall Risks No Fall Risks   Follow up Falls evaluation completed Falls evaluation completed Falls evaluation completed Falls evaluation completed Falls evaluation completed    [place]: Home free of loose throw rugs in walkways, pet beds, electrical cords, etc? Yes  Adequate lighting in your home to reduce risk of falls? Yes   ASSISTIVE DEVICES UTILIZED TO PREVENT FALLS: Life alert? No  Use of a cane, walker or w/c? No  Grab bars in the bathroom? No  Shower chair or bench in shower? No  Chair height toilet? Yes   TIMED UP AND GO: Was the test performed? Yes .  Length of time to ambulate 10 feet: 10 sec.   Gait slow and steady without use of assistive device  Cognitive Function:        10/06/2022    3:20 PM 07/09/2020   12:51 PM 07/07/2019   12:52 PM 07/05/2018   10:08 AM  6CIT Screen  What Year? 0 points 0 points 0 points 0 points  What month? 0 points 0 points 0 points 0 points  What time? 0 points  0 points 0 points  Count back from 20 0 points  0 points 0 points  Months in reverse 0 points 0 points 0 points 0 points  Repeat phrase 0 points 4 points 0 points 0 points  Total  Score 0 points  0 points 0 points    Immunizations Immunization History  Administered Date(s) Administered   Fluad Quad(high Dose 65+) 10/12/2019, 11/30/2020, 09/30/2021   Influenza Split 09/14/2014, 10/11/2014   Influenza, High Dose Seasonal PF 11/16/2017, 11/05/2018   Influenza,inj,Quad PF,6+ Mos 10/24/2015   Influenza-Unspecified 12/22/2016   PFIZER(Purple Top)SARS-COV-2 Vaccination 12/29/2019, 01/19/2020, 09/15/2020, 09/13/2021   Pfizer Covid-19 Vaccine Bivalent Booster 45yr & up 09/14/2022   Pneumococcal Conjugate-13 01/28/2016   Pneumococcal Polysaccharide-23  12/15/1998, 03/12/2011   Td 01/18/2013   Zoster, Live 07/05/2007, 01/22/2009     Shingrix Completed?: No.    Education has been provided regarding the importance of this vaccine. Patient has been advised to call insurance company to determine out of pocket expense if they have not yet received this vaccine. Advised may also receive vaccine at local pharmacy or Health Dept. Verbalized acceptance and understanding.  Screening Tests Health Maintenance  Topic Date Due   Zoster Vaccines- Shingrix (1 of 2) 01/06/2023 (Originally 05/11/1947)   INFLUENZA VACCINE  03/15/2023 (Originally 07/15/2022)   COVID-19 Vaccine (6 - Pfizer risk series) 11/09/2022   TETANUS/TDAP  01/18/2023   Pneumonia Vaccine 88 Years old  Completed   DEXA SCAN  Completed   HPV VACCINES  Aged Out    Health Maintenance  There are no preventive care reminders to display for this patient.   Lung Cancer Screening: (Low Dose CT Chest recommended if Age 86-80years, 30 pack-year currently smoking OR have quit w/in 15years.) does not qualify.   Hepatitis C Screening: does not qualify.  Vision Screening: Recommended annual ophthalmology exams for early detection of glaucoma and other disorders of the eye.  Dental Screening: Recommended annual dental exams for proper oral hygiene  Community Resource Referral / Chronic Care Management: CRR required this visit?  No   CCM required this visit?  No      Plan:     I have personally reviewed and noted the following in the patient's chart:   Medical and social history Use of alcohol, tobacco or illicit drugs  Current medications and supplements including opioid prescriptions. Patient is not currently taking opioid prescriptions. Functional ability and status Nutritional status Physical activity Advanced directives List of other physicians Hospitalizations, surgeries, and ER visits in previous 12 months Vitals Screenings to include cognitive, depression, and  falls Referrals and appointments  In addition, I have reviewed and discussed with patient certain preventive protocols, quality metrics, and best practice recommendations. A written personalized care plan for preventive services as well as general preventive health recommendations were provided to patient.     DLeta Jungling LPN   166/05/[id number]

## 2022-10-07 ENCOUNTER — Telehealth: Payer: Self-pay | Admitting: *Deleted

## 2022-10-07 NOTE — Chronic Care Management (AMB) (Signed)
  Care Coordination Note  10/07/2022 Name: MALLERY HARSHMAN MRN: [id number] DOB: [birth year]  RENESHIA ZUCCARO is a 88 y.o. year old female who is a primary care patient of Leone Haven, MD and is actively engaged with the care management team. I reached out to Lorenso Quarry by phone today to assist with re-scheduling an initial visit with the Licensed Clinical Social Worker  Follow up plan: Patient declines further follow up and engagement by the care management team. Appropriate care team members and provider have been notified via electronic communication.   Pt appreciative but is currently working with [facility] in [place] for help with son    Julian Hy, [place] Direct Dial: [phone]

## 2022-10-08 ENCOUNTER — Ambulatory Visit: Payer: PPO | Admitting: Family Medicine

## 2022-10-22 ENCOUNTER — Other Ambulatory Visit: Payer: Self-pay

## 2022-10-22 DIAGNOSIS — J302 Other seasonal allergic rhinitis: Secondary | ICD-10-CM

## 2022-10-22 MED ORDER — FLUTICASONE PROPIONATE 50 MCG/ACT NA SUSP: NASAL | 0 refills | Status: DC

## 2022-11-04 DIAGNOSIS — H353114 Nonexudative age-related macular degeneration, right eye, advanced atrophic with subfoveal involvement: Secondary | ICD-10-CM | POA: Diagnosis not present

## 2023-01-10 ENCOUNTER — Other Ambulatory Visit: Payer: Self-pay | Admitting: Family Medicine

## 2023-03-23 ENCOUNTER — Encounter: Payer: Self-pay | Admitting: Family Medicine

## 2023-03-23 ENCOUNTER — Ambulatory Visit (INDEPENDENT_AMBULATORY_CARE_PROVIDER_SITE_OTHER): Payer: PPO | Admitting: Family Medicine

## 2023-03-23 VITALS — BP 120/78 | HR 55 | Temp 98.1°F | Ht 64.0 in | Wt 147.2 lb

## 2023-03-23 DIAGNOSIS — I493 Ventricular premature depolarization: Secondary | ICD-10-CM

## 2023-03-23 DIAGNOSIS — L570 Actinic keratosis: Secondary | ICD-10-CM

## 2023-03-23 DIAGNOSIS — M25561 Pain in right knee: Secondary | ICD-10-CM | POA: Diagnosis not present

## 2023-03-23 DIAGNOSIS — I10 Essential (primary) hypertension: Secondary | ICD-10-CM

## 2023-03-23 DIAGNOSIS — G629 Polyneuropathy, unspecified: Secondary | ICD-10-CM | POA: Diagnosis not present

## 2023-03-23 MED ORDER — TRIAMTERENE-HCTZ 37.5-25 MG PO TABS: ORAL_TABLET | ORAL | 3 refills | Status: DC

## 2023-03-23 NOTE — Assessment & Plan Note (Addendum)
Chronic issue.  Well-controlled.  Patient will continue metoprolol  50 mg daily and Maxide half a tablet daily.

## 2023-03-23 NOTE — Assessment & Plan Note (Signed)
Patient will contact her dermatologist for evaluation of the skin lesions.

## 2023-03-23 NOTE — Progress Notes (Signed)
Marikay Alar, MD Phone: [phone]  Jessica Reynolds is a 89 y.o. female who presents today for f/u.  HYPERTENSION Disease Monitoring Home BP Monitoring not checking Chest pain- no    Dyspnea- improved from previously Medications Compliance-  taking metoprolol, maxzide.  Edema- no BMET    Component Value Date/Time   NA 136 09/19/2022 1157   K 3.8 09/19/2022 1157   CL 101 09/19/2022 1157   CO2 28 09/19/2022 1157   GLUCOSE 103 (H) 09/19/2022 1157   BUN 16 09/19/2022 1157   CREATININE 0.87 09/19/2022 1157   CALCIUM 9.3 09/19/2022 1157   GFRNONAA 51 (L) 01/09/2016 1740   GFRAA 59 (L) 01/09/2016 1740   Neuropathy: Patient continues to have some tingling and numbness in her feet bilaterally.  She notes no pain.  She wonders what can be done for this.  Right knee pain: Patient notes this has been hurting since December 2023.  It feels a little "squeaky."  Notes it does pop some.  It does not give out on her.  It does not not lock up on her.  It is better when she sitting down.  Has been wearing a brace.  She notes the pain is along the medial joint line.  Skin lesions: Patient notes dry spots on her face that have come up recently.  She does have a dermatologist.   Social History   Tobacco Use  Smoking Status Never  Smokeless Tobacco Never    Current Outpatient Medications on File Prior to Visit  Medication Sig Dispense Refill   aspirin EC 81 MG tablet Take 81 mg by mouth daily.     fluticasone (FLONASE) 50 MCG/ACT nasal spray Use 2 spray(s) in each nostril once daily 16 g 0   ibuprofen (ADVIL) 200 MG tablet Take 200 mg by mouth every 6 (six) hours as needed.     metoprolol succinate (TOPROL-XL) 50 MG 24 hr tablet Take 1 tablet by mouth once daily 90 tablet 1   Multiple Vitamins-Minerals (PRESERVISION/LUTEIN) CAPS Take 2 capsules by mouth daily.     No current facility-administered medications on file prior to visit.     ROS see history of present  illness  Objective  Physical Exam Vitals:   03/23/23 1031  BP: 120/78  Pulse: (!) 55  Temp: 98.1 F (36.7 C)  SpO2: 96%    BP Readings from Last 3 Encounters:  03/23/23 120/78  10/06/22 126/76  09/19/22 110/70   Wt Readings from Last 3 Encounters:  03/23/23 147 lb 3.2 oz (66.8 kg)  10/06/22 145 lb 9.6 oz (66 kg)  09/19/22 143 lb (64.9 kg)    Physical Exam Constitutional:      General: She is not in acute distress.    Appearance: She is not diaphoretic.  HENT:     Head:   Cardiovascular:     Rate and Rhythm: Normal rate and regular rhythm.     Heart sounds: Normal heart sounds.  Pulmonary:     Effort: Pulmonary effort is normal.     Breath sounds: Normal breath sounds.  Musculoskeletal:     Comments: Right knee with no swelling, warmth, or erythema.  There is some tenderness over the medial joint line, no other tenderness noted.  Skin:    General: Skin is warm and dry.  Neurological:     Mental Status: She is alert.      Assessment/Plan: Please see individual problem list.  Benign essential HTN Assessment & Plan: Chronic issue.  Well-controlled.  Patient will continue metoprolol  50 mg daily and Maxide half a tablet daily.  Orders: -     Triamterene-HCTZ; TAKE 1/2 (ONE-HALF) TABLET BY MOUTH IN THE MORNING  Dispense: 45 tablet; Refill: 3  Neuropathy Assessment & Plan: Chronic issue.  Prior lab work revealed prediabetes.  Discussed potentially trying gabapentin or Lyrica.  Patient was given information on these medications.  Discussed the risk of balance issues and drowsiness with these medications.  She will let me know if she would like to try either of them.   Frequent PVCs Assessment & Plan: Chronic issue.  Was following with Dr. Gwen Pounds though he has retired.  Will refer to [facility] cardiology.  Orders: -     Ambulatory referral to Cardiology  Acute pain of right knee Assessment & Plan: Concern for possible degenerative meniscal issue.  Refer to  Dr. Martha Clan at [facility].  Orders: -     Ambulatory referral to Orthopedic Surgery  Actinic keratoses Assessment & Plan: Patient will contact her dermatologist for evaluation of the skin lesions.      Return in about 6 months (around 09/22/2023).   Marikay Alar, MD [facility]

## 2023-03-23 NOTE — Patient Instructions (Signed)
Nice to see you. Orthopedics and cardiology should contact you.  If you do not hear from them in the next 2 weeks please let us know. Please call your dermatologist to schedule an evaluation. If you would like to try the gabapentin or the Lyrica for your neuropathy please let me know.  Pregabalin Capsules What is this medication? PREGABALIN (pre GAB a lin) treats nerve pain. It may also be used to prevent and control seizures in people with epilepsy. It works by calming overactive nerves in your body. This medicine may be used for other purposes; ask your health care provider or pharmacist if you have questions. COMMON BRAND NAME(S): Lyrica What should I tell my care team before I take this medication? They need to know if you have any of these conditions: Heart failure Kidney disease Lung disease Substance use disorder Suicidal thoughts, plans or attempt by you or a family member An unusual or allergic reaction to pregabalin, other medications, foods, dyes, or preservatives Pregnant or trying to get pregnant Breast-feeding How should I use this medication? Take this medication by mouth with water. Take it as directed on the prescription label at the same time every day. You can take it with or without food. If it upsets your stomach, take it with food. Keep taking it unless your care team tells you to stop. A special MedGuide will be given to you by the pharmacist with each prescription and refill. Be sure to read this information carefully each time. Talk to your care team about the use of this medication in children. While it may be prescribed for children as young as 1 month for selected conditions, precautions do apply. Overdosage: If you think you have taken too much of this medicine contact a poison control center or emergency room at once. NOTE: This medicine is only for you. Do not share this medicine with others. What if I miss a dose? If you miss a dose, take it as soon as you  can. If it is almost time for your next dose, take only that dose. Do not take double or extra doses. What may interact with this medication? This medication may interact with the following: Alcohol Antihistamines for allergy, cough, and cold Certain medications for anxiety or sleep Certain medications for blood pressure, heart disease Certain medications for depression like amitriptyline, fluoxetine, sertraline Certain medications for diabetes, like pioglitazone, rosiglitazone Certain medications for seizures like phenobarbital, primidone General anesthetics like halothane, isoflurane, methoxyflurane, propofol Medications that relax muscles for surgery Opioid medications for pain Phenothiazines like chlorpromazine, mesoridazine, prochlorperazine, thioridazine This list may not describe all possible interactions. Give your health care provider a list of all the medicines, herbs, non-prescription drugs, or dietary supplements you use. Also tell them if you smoke, drink alcohol, or use illegal drugs. Some items may interact with your medicine. What should I watch for while using this medication? Visit your care team for regular checks on your progress. Tell your care team if your symptoms do not start to get better or if they get worse. Do not suddenly stop taking this medication. You may develop a severe reaction. Your care team will tell you how much medication to take. If your care team wants you to stop the medication, the dose may be slowly lowered over time to avoid any side effects. This medication may affect your coordination, reaction time, or judgment. Do not drive or operate machinery until you know how this medication affects you. Sit up or stand slowly  to reduce the risk of dizzy or fainting spells. Drinking alcohol with this medication can increase the risk of these side effects. If you or your family notice any changes in your behavior, such as new or worsening depression, thoughts of  harming yourself, anxiety, other unusual or disturbing thoughts, or memory loss, call your care team right away. Wear a medical ID bracelet or chain if you are taking this medication for seizures. Carry a card that describes your condition. List the medications and doses you take on the card. This medication may make it more difficult to father a child. Talk to your care team if you are concerned about your fertility. What side effects may I notice from receiving this medication? Side effects that you should report to your care team as soon as possible: Allergic reactions or angioedema--skin rash, itching, hives, swelling of the face, eyes, lips, tongue, arms, or legs, trouble swallowing or breathing Blurry vision Thoughts of suicide or self-harm, worsening mood, feelings of depression Trouble breathing Side effects that usually do not require medical attention (report to your care team if they continue or are bothersome): Dizziness Drowsiness Dry mouth Nausea Swelling of the ankles, feet, hands Vomiting Weight gain This list may not describe all possible side effects. Call your doctor for medical advice about side effects. You may report side effects to FDA at 1-800-FDA-1088. Where should I keep my medication? Keep out of the reach of children and pets. This medication can be abused. Keep it in a safe place to protect it from theft. Do not share it with anyone. It is only for you. Selling or giving away this medication is dangerous and against the law. Store at [employer] C (77 degrees F). Get rid of any unused medication after the expiration date. This medication may cause harm and death if it is taken by other adults, children, or pets. It is important to get rid of the medication as soon as you no longer need it, or it is expired. You can do this in two ways: Take the medication to a medication take-back program. Check with your pharmacy or law enforcement to find a location. If you cannot  return the medication, check the label or package insert to see if the medication should be thrown out in the garbage or flushed down the toilet. If you are not sure, ask your care team. If it is safe to put it in the trash, take the medication out of the container. Mix the medication with cat litter, dirt, coffee grounds, or other unwanted substance. Seal the mixture in a bag or container. Put it in the trash. NOTE: This sheet is a summary. It may not cover all possible information. If you have questions about this medicine, talk to your doctor, pharmacist, or health care provider.  2023 Elsevier/Gold Standard (2021-08-23 00:00:00)   Gabapentin Capsules or Tablets What is this medication? GABAPENTIN (GA ba pen tin) treats nerve pain. It may also be used to prevent and control seizures in people with epilepsy. It works by calming overactive nerves in your body. This medicine may be used for other purposes; ask your health care provider or pharmacist if you have questions. COMMON BRAND NAME(S): Active-PAC with Gabapentin, Ascencion Dike, Gralise, Neurontin What should I tell my care team before I take this medication? They need to know if you have any of these conditions: Alcohol or substance use disorder Kidney disease Lung or breathing disease Suicidal thoughts, plans, or attempt; a previous suicide attempt by  you or a family member An unusual or allergic reaction to gabapentin, other medications, foods, dyes, or preservatives Pregnant or trying to get pregnant Breast-feeding How should I use this medication? Take this medication by mouth with a glass of water. Follow the directions on the prescription label. You can take it with or without food. If it upsets your stomach, take it with food. Take your medication at regular intervals. Do not take it more often than directed. Do not stop taking except on your care team's advice. If you are directed to break the 600 or 800 mg tablets in half as part of  your dose, the extra half tablet should be used for the next dose. If you have not used the extra half tablet within 28 days, it should be thrown away. A special MedGuide will be given to you by the pharmacist with each prescription and refill. Be sure to read this information carefully each time. Talk to your care team about the use of this medication in children. While this medication may be prescribed for children as young as 3 years for selected conditions, precautions do apply. Overdosage: If you think you have taken too much of this medicine contact a poison control center or emergency room at once. NOTE: This medicine is only for you. Do not share this medicine with others. What if I miss a dose? If you miss a dose, take it as soon as you can. If it is almost time for your next dose, take only that dose. Do not take double or extra doses. What may interact with this medication? Alcohol Antihistamines for allergy, cough, and cold Certain medications for anxiety or sleep Certain medications for depression like amitriptyline, fluoxetine, sertraline Certain medications for seizures like phenobarbital, primidone Certain medications for stomach problems General anesthetics like halothane, isoflurane, methoxyflurane, propofol Local anesthetics like lidocaine, pramoxine, tetracaine Medications that relax muscles for surgery Opioid medications for pain Phenothiazines like chlorpromazine, mesoridazine, prochlorperazine, thioridazine This list may not describe all possible interactions. Give your health care provider a list of all the medicines, herbs, non-prescription drugs, or dietary supplements you use. Also tell them if you smoke, drink alcohol, or use illegal drugs. Some items may interact with your medicine. What should I watch for while using this medication? Visit your care team for regular checks on your progress. You may want to keep a record at home of how you feel your condition is  responding to treatment. You may want to share this information with your care team at each visit. You should contact your care team if your seizures get worse or if you have any new types of seizures. Do not stop taking this medication or any of your seizure medications unless instructed by your care team. Stopping your medication suddenly can increase your seizures or their severity. This medication may cause serious skin reactions. They can happen weeks to months after starting the medication. Contact your care team right away if you notice fevers or flu-like symptoms with a rash. The rash may be red or purple and then turn into blisters or peeling of the skin. Or, you might notice a red rash with swelling of the face, lips or lymph nodes in your neck or under your arms. Wear a medical identification bracelet or chain if you are taking this medication for seizures. Carry a card that lists all your medications. This medication may affect your coordination, reaction time, or judgment. Do not drive or operate machinery until you know how  this medication affects you. Sit up or stand slowly to reduce the risk of dizzy or fainting spells. Drinking alcohol with this medication can increase the risk of these side effects. Your mouth may get dry. Chewing sugarless gum or sucking hard candy, and drinking plenty of water may help. Watch for new or worsening thoughts of suicide or depression. This includes sudden changes in mood, behaviors, or thoughts. These changes can happen at any time but are more common in the beginning of treatment or after a change in dose. Call your care team right away if you experience these thoughts or worsening depression. If you become pregnant while using this medication, you may enroll in the [place] American Antiepileptic Drug Pregnancy Registry by calling [phone]. This registry collects information about the safety of antiepileptic medication use during pregnancy. What side  effects may I notice from receiving this medication? Side effects that you should report to your care team as soon as possible: Allergic reactions or angioedema--skin rash, itching, hives, swelling of the face, eyes, lips, tongue, arms, or legs, trouble swallowing or breathing Rash, fever, and swollen lymph nodes Thoughts of suicide or self harm, worsening mood, feelings of depression Trouble breathing Unusual changes in mood or behavior in children after use such as difficulty concentrating, hostility, or restlessness Side effects that usually do not require medical attention (report to your care team if they continue or are bothersome): Dizziness Drowsiness Nausea Swelling of ankles, feet, or hands Vomiting This list may not describe all possible side effects. Call your doctor for medical advice about side effects. You may report side effects to FDA at 1-800-FDA-1088. Where should I keep my medication? Keep out of reach of children and pets. Store at room temperature between 15 and 30 degrees C (59 and 86 degrees F). Get rid of any unused medication after the expiration date. This medication may cause accidental overdose and death if taken by other adults, children, or pets. To get rid of medications that are no longer needed or have expired: Take the medication to a medication take-back program. Check with your pharmacy or law enforcement to find a location. If you cannot return the medication, check the label or package insert to see if the medication should be thrown out in the garbage or flushed down the toilet. If you are not sure, ask your care team. If it is safe to put it in the trash, empty the medication out of the container. Mix the medication with cat litter, dirt, coffee grounds, or other unwanted substance. Seal the mixture in a bag or container. Put it in the trash. NOTE: This sheet is a summary. It may not cover all possible information. If you have questions about this  medicine, talk to your doctor, pharmacist, or health care provider.  2023 Elsevier/Gold Standard (2021-06-03 00:00:00)

## 2023-03-23 NOTE — Assessment & Plan Note (Signed)
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  Refer to Dr[facility] Martha Clan at [facility].

## 2023-03-23 NOTE — Assessment & Plan Note (Signed)
Chronic issue.  Was following with Dr. Gwen Pounds though he has retired.  Will refer to [facility] cardiology.

## 2023-03-23 NOTE — Assessment & Plan Note (Signed)
Chronic issue.  Prior lab work revealed prediabetes.  Discussed potentially trying gabapentin or Lyrica.  Patient was given information on these medications.  Discussed the risk of balance issues and drowsiness with these medications.  She will let me know if she would like to try either of them.

## 2023-03-26 ENCOUNTER — Ambulatory Visit: Payer: PPO | Admitting: Dermatology

## 2023-03-26 VITALS — BP 122/69

## 2023-03-26 DIAGNOSIS — L820 Inflamed seborrheic keratosis: Secondary | ICD-10-CM | POA: Diagnosis not present

## 2023-03-26 DIAGNOSIS — L720 Epidermal cyst: Secondary | ICD-10-CM

## 2023-03-26 DIAGNOSIS — L578 Other skin changes due to chronic exposure to nonionizing radiation: Secondary | ICD-10-CM

## 2023-03-26 DIAGNOSIS — L729 Follicular cyst of the skin and subcutaneous tissue, unspecified: Secondary | ICD-10-CM

## 2023-03-26 DIAGNOSIS — L570 Actinic keratosis: Secondary | ICD-10-CM

## 2023-03-26 NOTE — Patient Instructions (Addendum)
Cryotherapy Aftercare  Wash gently with soap and water everyday.   Apply Vaseline and Band-Aid daily until healed.   Aklief cream, apply to whilte bump chin, tiny amount is all you need  Due to recent changes in healthcare laws, you may see results of your pathology and/or laboratory studies on MyChart before the doctors have had a chance to review them. We understand that in some cases there may be results that are confusing or concerning to you. Please understand that not all results are received at the same time and often the doctors may need to interpret multiple results in order to provide you with the best plan of care or course of treatment. Therefore, we ask that you please give [place] 2 business days to thoroughly review all your results before contacting the office for clarification. Should we see a critical lab result, you will be contacted sooner.   If You Need Anything After Your Visit  If you have any questions or concerns for your doctor, please call our main line at [phone] and press option 4 to reach your doctor's medical assistant. If no one answers, please leave a voicemail as directed and we will return your call as soon as possible. Messages left after 4 pm will be answered the following business day.   You may also send [place] a message via MyChart. We typically respond to MyChart messages within 1-2 business days.  For prescription refills, please ask your pharmacy to contact our office. Our fax number is [phone].  If you have an urgent issue when the clinic is closed that cannot wait until the next business day, you can page your doctor at the number below.    Please note that while we do our best to be available for urgent issues outside of office hours, we are not available 24/7.   If you have an urgent issue and are unable to reach [place], you may choose to seek medical care at your doctor's office, retail clinic, urgent care center, or emergency room.  If you have a  medical emergency, please immediately call 911 or go to the emergency department.  Pager Numbers  - Dr. Gwen Pounds: [phone]  - Dr. Neale Burly: [phone]  - Dr. Roseanne Reno: [phone]  In the event of inclement weather, please call our main line at [phone] for an update on the status of any delays or closures.  Dermatology Medication Tips: Please keep the boxes that topical medications come in in order to help keep track of the instructions about where and how to use these. Pharmacies typically print the medication instructions only on the boxes and not directly on the medication tubes.   If your medication is too expensive, please contact our office at [phone] option 4 or send [place] a message through MyChart.   We are unable to tell what your co-pay for medications will be in advance as this is different depending on your insurance coverage. However, we may be able to find a substitute medication at lower cost or fill out paperwork to get insurance to cover a needed medication.   If a prior authorization is required to get your medication covered by your insurance company, please allow [place] 1-2 business days to complete this process.  Drug prices often vary depending on where the prescription is filled and some pharmacies may offer cheaper prices.  The website www.goodrx.com contains coupons for medications through different pharmacies. The prices here do not account for what the cost may be with help from  insurance (it may be cheaper with your insurance), but the website can give you the price if you did not use any insurance.  - You can print the associated coupon and take it with your prescription to the pharmacy.  - You may also stop by our office during regular business hours and pick up a GoodRx coupon card.  - If you need your prescription sent electronically to a different pharmacy, notify our office through [facility] or by phone at [phone] option 4.     Si  Usted Necesita Algo Despus de Su Visita  Tambin puede enviarnos un mensaje a travs de Pharmacist, community. Por lo general respondemos a los mensajes de MyChart en el transcurso de 1 a 2 das hbiles.  Para renovar recetas, por favor pida a su farmacia que se ponga en contacto con nuestra oficina. Harland Dingwall de fax es [place] [phone].  Si tiene un asunto urgente cuando la clnica est cerrada y que no puede esperar hasta el siguiente da hbil, puede llamar/localizar a su doctor(a) al nmero que aparece a continuacin.   Por favor, tenga en cuenta que aunque hacemos todo lo posible para estar disponibles para asuntos urgentes fuera del horario de [place], no estamos disponibles las 24 horas del da, los 7 das de la [place].   Si tiene un problema urgente y no puede comunicarse con nosotros, puede optar por buscar atencin mdica  en el consultorio de su doctor(a), en una clnica privada, en un centro de atencin urgente o en una sala de emergencias.  Si tiene Engineering geologist, por favor llame inmediatamente al 911 o vaya a la sala de emergencias.  Nmeros de bper  - Dr. Nehemiah Massed: [phone]  - Dra. Moye: [phone]  - Dra. Nicole Kindred: [phone]  En caso de inclemencias del [place], por favor llame a Johnsie Kindred principal al [phone] para una actualizacin sobre el [place] de cualquier retraso o cierre.  Consejos para la medicacin en dermatologa: Por favor, guarde las cajas en las que vienen los medicamentos de uso tpico para ayudarle a seguir las instrucciones sobre dnde y cmo usarlos. Las farmacias generalmente imprimen las instrucciones del medicamento slo en las cajas y no directamente en los tubos del [employer].   Si su medicamento es muy caro, por favor, pngase en contacto con Zigmund Daniel llamando al [phone] y presione la opcin 4 o envenos un mensaje a travs de Pharmacist, community.   No podemos decirle cul ser su copago por los medicamentos por adelantado ya que  esto es diferente dependiendo de la cobertura de su seguro. Sin embargo, es posible que podamos encontrar un medicamento sustituto a Electrical engineer un formulario para que el seguro cubra el medicamento que se considera necesario.   Si se requiere una autorizacin previa para que su compaa de seguros [place] su medicamento, por favor permtanos de 1 a 2 das hbiles para completar este proceso.  Los precios de los medicamentos varan con frecuencia dependiendo del Environmental consultant de dnde se surte la receta y alguna farmacias pueden ofrecer precios ms baratos.  El sitio web www.goodrx.com tiene cupones para medicamentos de Airline pilot. Los precios aqu no tienen en cuenta lo que podra costar con la ayuda del seguro (puede ser ms barato con su seguro), pero el sitio web puede darle el precio si no utiliz Research scientist (physical sciences).  - Puede imprimir el cupn correspondiente y llevarlo con su receta a la farmacia.  - Tambin puede pasar por nuestra oficina durante el horario de  atencin regular y recoger una tarjeta de cupones de GoodRx.  - Si necesita que su receta se enve electrnicamente a una farmacia diferente, informe a nuestra oficina a travs de MyChart de [place] o por telfono llamando al [phone] y presione la opcin 4.  

## 2023-03-26 NOTE — Progress Notes (Signed)
Follow-Up Visit   Subjective  Jessica Reynolds is a 89 y.o. female who presents for the following: Dry scaly patches face, shoulders, ~48yr, no symptoms The patient has spots, moles and lesions to be evaluated, some may be new or changing and the patient has concerns that these could be cancer.  The following portions of the chart were reviewed this encounter and updated as appropriate: medications, allergies, medical history  Review of Systems:  No other skin or systemic complaints except as noted in HPI or Assessment and Plan.  Objective  Well appearing patient in no apparent distress; mood and affect are within normal limits.  A focused examination was performed of the following areas: Face, shoulders Relevant exam findings are noted in the Assessment and Plan.   Assessment & Plan   INFLAMED SEBORRHEIC KERATOSIS Exam: Erythematous keratotic or waxy stuck-on papule or plaque.  Symptomatic, irritating, patient would like treated.  Benign-appearing.  Call clinic for new or changing lesions.   Prior to procedure, discussed risks of blister formation, small wound, skin dyspigmentation, or rare scar following treatment. Recommend Vaseline ointment to treated areas while healing.  Destruction Procedure Note Destruction method: cryotherapy   Informed consent: discussed and consent obtained   Lesion destroyed using liquid nitrogen: Yes   Outcome: patient tolerated procedure well with no complications   Post-procedure details: wound care instructions given   Locations: face, back, neck # of Lesions Treated: 18   ACTINIC KERATOSIS Exam: Erythematous thin papules/macules with gritty scale  Actinic keratoses are precancerous spots that appear secondary to cumulative UV radiation exposure/sun exposure over time. They are chronic with expected duration over 1 year. A portion of actinic keratoses will progress to squamous cell carcinoma of the skin. It is not possible to reliably  predict which spots will progress to skin cancer and so treatment is recommended to prevent development of skin cancer.  Recommend daily broad spectrum sunscreen SPF 30+ to sun-exposed areas, reapply every 2 hours as needed.  Recommend staying in the shade or wearing long sleeves, sun glasses (UVA+UVB protection) and wide brim hats (4-inch brim around the entire circumference of the hat). Call for new or changing lesions.  Treatment Plan:  Prior to procedure, discussed risks of blister formation, small wound, skin dyspigmentation, or rare scar following cryotherapy. Recommend Vaseline ointment to treated areas while healing.  Destruction Procedure Note Destruction method: cryotherapy   Informed consent: discussed and consent obtained   Lesion destroyed using liquid nitrogen: Yes   Outcome: patient tolerated procedure well with no complications   Post-procedure details: wound care instructions given   Locations: forehead # of Lesions Treated: 1   Milia - tiny firm white papules - type of cyst - benign - may be extracted if symptomatic - observe  - face - Start Aklief cr qhs to aa face, sample x given Lot [id number] exp 05/25  ACTINIC DAMAGE - chronic, secondary to cumulative UV radiation exposure/sun exposure over time - diffuse scaly erythematous macules with underlying dyspigmentation - Recommend daily broad spectrum sunscreen SPF 30+ to sun-exposed areas, reapply every 2 hours as needed.  - Recommend staying in the shade or wearing long sleeves, sun glasses (UVA+UVB protection) and wide brim hats (4-inch brim around the entire circumference of the hat). - Call for new or changing lesions.  Return in about 1 year (around 03/25/2024) for AK f/u, ISK f/u.  I, Ardis Rowan, RMA, am acting as scribe for Armida Sans, MD .  Documentation: I have reviewed the  above documentation for accuracy and completeness, and I agree with the above.  Armida Sans, MD

## 2023-04-06 DIAGNOSIS — M2241 Chondromalacia patellae, right knee: Secondary | ICD-10-CM | POA: Diagnosis not present

## 2023-04-06 DIAGNOSIS — M1711 Unilateral primary osteoarthritis, right knee: Secondary | ICD-10-CM | POA: Diagnosis not present

## 2023-04-12 ENCOUNTER — Encounter: Payer: Self-pay | Admitting: Dermatology

## 2023-05-01 DIAGNOSIS — M25561 Pain in right knee: Secondary | ICD-10-CM | POA: Diagnosis not present

## 2023-05-05 DIAGNOSIS — H353114 Nonexudative age-related macular degeneration, right eye, advanced atrophic with subfoveal involvement: Secondary | ICD-10-CM | POA: Diagnosis not present

## 2023-05-05 DIAGNOSIS — Z961 Presence of intraocular lens: Secondary | ICD-10-CM | POA: Diagnosis not present

## 2023-05-05 DIAGNOSIS — H353124 Nonexudative age-related macular degeneration, left eye, advanced atrophic with subfoveal involvement: Secondary | ICD-10-CM | POA: Diagnosis not present

## 2023-05-07 ENCOUNTER — Other Ambulatory Visit: Payer: Self-pay | Admitting: Family Medicine

## 2023-05-07 DIAGNOSIS — M25561 Pain in right knee: Secondary | ICD-10-CM | POA: Diagnosis not present

## 2023-05-07 DIAGNOSIS — J302 Other seasonal allergic rhinitis: Secondary | ICD-10-CM

## 2023-05-28 ENCOUNTER — Ambulatory Visit: Payer: PPO | Admitting: Cardiology

## 2023-06-03 DIAGNOSIS — M25561 Pain in right knee: Secondary | ICD-10-CM | POA: Diagnosis not present

## 2023-06-15 DIAGNOSIS — M25561 Pain in right knee: Secondary | ICD-10-CM | POA: Diagnosis not present

## 2023-06-23 DIAGNOSIS — M25561 Pain in right knee: Secondary | ICD-10-CM | POA: Diagnosis not present

## 2023-07-27 ENCOUNTER — Other Ambulatory Visit: Payer: Self-pay | Admitting: Family Medicine

## 2023-07-27 DIAGNOSIS — J302 Other seasonal allergic rhinitis: Secondary | ICD-10-CM

## 2023-08-03 ENCOUNTER — Telehealth: Payer: Self-pay

## 2023-08-03 DIAGNOSIS — R300 Dysuria: Secondary | ICD-10-CM | POA: Diagnosis not present

## 2023-08-03 DIAGNOSIS — R319 Hematuria, unspecified: Secondary | ICD-10-CM | POA: Diagnosis not present

## 2023-08-03 DIAGNOSIS — N390 Urinary tract infection, site not specified: Secondary | ICD-10-CM | POA: Diagnosis not present

## 2023-08-03 NOTE — Telephone Encounter (Signed)
Patient states she has just come back from [facility] for a UTI.  Patient states [facility] prescribed doxycycline (one tablet).  Patient states Dr. Marikay Alar has always instructed her to take two tablets to start with.  Patient states she told PA at [facility] that Dr. Birdie Sons usually has her take two, but the instructions on her prescription state patient is to take one tablet.  Patient states she would like to hear what Dr. Birdie Sons has to say before she starts taking the medication.

## 2023-08-04 NOTE — Telephone Encounter (Signed)
On review of the chart/note and prescription that was sent and it looks like they sent doxycycline to take 1 tablet twice daily.  That is how she should take the doxycycline.

## 2023-08-04 NOTE — Telephone Encounter (Signed)
LMTCB

## 2023-08-07 DIAGNOSIS — R110 Nausea: Secondary | ICD-10-CM | POA: Diagnosis not present

## 2023-08-07 DIAGNOSIS — M545 Low back pain, unspecified: Secondary | ICD-10-CM | POA: Diagnosis not present

## 2023-08-07 DIAGNOSIS — R531 Weakness: Secondary | ICD-10-CM | POA: Diagnosis not present

## 2023-08-07 DIAGNOSIS — R5383 Other fatigue: Secondary | ICD-10-CM | POA: Diagnosis not present

## 2023-08-07 NOTE — Telephone Encounter (Signed)
Patient just returned [facility] phone call. Can someone call her back at [phone].

## 2023-08-07 NOTE — Telephone Encounter (Signed)
Left message to call the office back.

## 2023-08-07 NOTE — Telephone Encounter (Signed)
Pt returned [facility] CMA call. Unable to transfer. Note below was read to her. Pt stated she will like a call back to explain her reactions she had by taking previous med. Pt stated her best contact its [phone].

## 2023-08-07 NOTE — Telephone Encounter (Signed)
Spoke to Patient she is going to go to [facility] walk in now.

## 2023-08-07 NOTE — Telephone Encounter (Signed)
She needs to be rechecked to determine if the UTI has gone away.  All of the symptoms she noted could be coming from a UTI or could be coming from a side effect of the antibiotic.  She can be offered an appointment with any of our providers or she could go back to [facility] walk-in clinic.

## 2023-08-07 NOTE — Telephone Encounter (Signed)
Patient states on 08/03/23 she went to [facility] and was diagnosed with a UTI. She was given Doxycycline and the Patient thinks she is having side effects. Symptoms are weak, tired, lower back pain with pain level 3/10, headache and nauseated. Patient also states the pain is better when she urinates and her urine is a little yellow now. Patient would like to know what to do?

## 2023-08-18 DIAGNOSIS — H6983 Other specified disorders of Eustachian tube, bilateral: Secondary | ICD-10-CM | POA: Diagnosis not present

## 2023-08-18 DIAGNOSIS — H903 Sensorineural hearing loss, bilateral: Secondary | ICD-10-CM | POA: Diagnosis not present

## 2023-09-01 DIAGNOSIS — M25561 Pain in right knee: Secondary | ICD-10-CM | POA: Diagnosis not present

## 2023-09-14 ENCOUNTER — Other Ambulatory Visit: Payer: Self-pay

## 2023-09-14 ENCOUNTER — Telehealth: Payer: Self-pay

## 2023-09-14 MED ORDER — METOPROLOL SUCCINATE ER 50 MG PO TB24: 50.0000 mg | ORAL_TABLET | Freq: Every day | ORAL | 1 refills | Status: DC

## 2023-09-14 NOTE — Telephone Encounter (Signed)
Prescription sent to Pharmacy.  

## 2023-09-14 NOTE — Telephone Encounter (Signed)
Prescription Request  09/14/2023  LOV: Visit date not found  What is the name of the medication or equipment? metoprolol succinate (TOPROL-XL) 50 MG 24 hr tablet  Have you contacted your pharmacy to request a refill? Yes   Which pharmacy would you like this sent to?  [facility] [address], [place] - [address] [place] [address] Phone: [phone] Fax: [phone]    Patient notified that their request is being sent to the clinical staff for review and that they should receive a response within 2 business days.   Please advise at [phone] [facility])  Grover Canavan is calling from [facility] to state patient has been out of this medication for three days and they need to have a new prescription.  Grover Canavan states she would like to speak with Dr. Bernardo Heater nurse.  I was unable to reach Prince Solian, CMA, at the time of the call.  I let Grover Canavan know that I will send a message to Dr. Purvis Sheffield CMA.  Grover Canavan states patient just walked into the pharmacy and let them know that she has been out of this medication for three days.

## 2023-09-22 ENCOUNTER — Encounter: Payer: Self-pay | Admitting: Family Medicine

## 2023-09-22 ENCOUNTER — Ambulatory Visit (INDEPENDENT_AMBULATORY_CARE_PROVIDER_SITE_OTHER): Payer: PPO | Admitting: Family Medicine

## 2023-09-22 VITALS — BP 122/76 | HR 57 | Temp 97.9°F | Ht 64.0 in | Wt 152.6 lb

## 2023-09-22 DIAGNOSIS — R7303 Prediabetes: Secondary | ICD-10-CM | POA: Diagnosis not present

## 2023-09-22 DIAGNOSIS — I10 Essential (primary) hypertension: Secondary | ICD-10-CM | POA: Diagnosis not present

## 2023-09-22 DIAGNOSIS — I493 Ventricular premature depolarization: Secondary | ICD-10-CM | POA: Diagnosis not present

## 2023-09-22 DIAGNOSIS — Z23 Encounter for immunization: Secondary | ICD-10-CM

## 2023-09-22 DIAGNOSIS — J309 Allergic rhinitis, unspecified: Secondary | ICD-10-CM | POA: Insufficient documentation

## 2023-09-22 MED ORDER — CLARITIN 5 MG PO CHEW: 5.0000 mg | CHEWABLE_TABLET | Freq: Every day | ORAL | 3 refills | Status: DC

## 2023-09-22 NOTE — Assessment & Plan Note (Signed)
Chronic issue.  Well-controlled.  Patient will continue metoprolol 50 mg daily and Maxzide half a tablet daily.  Check labs.

## 2023-09-22 NOTE — Assessment & Plan Note (Signed)
Chronic issue.  Patient's cardiologist retired.  She is requesting referral to a new cardiologist.

## 2023-09-22 NOTE — Progress Notes (Signed)
Marikay Alar, MD Phone: [phone]  Jessica Reynolds is a 89 y.o. female who presents today for f/u.  HYPERTENSION Disease Monitoring Home BP Monitoring not checking Chest pain- no    Dyspnea- only when she was off metoprolol a week ago, resolved with resuming the metoprolol Medications Compliance-  taking metoprolol, triamtere/HCTZ  Edema- some in feet resolves over night, no orthopnea or PND BMET    Component Value Date/Time   NA 136 09/19/2022 1157   K 3.8 09/19/2022 1157   CL 101 09/19/2022 1157   CO2 28 09/19/2022 1157   GLUCOSE 103 (H) 09/19/2022 1157   BUN 16 09/19/2022 1157   CREATININE 0.87 09/19/2022 1157   CALCIUM 9.3 09/19/2022 1157   GFRNONAA 51 (L) 01/09/2016 1740   GFRAA 59 (L) 01/09/2016 1740   Allergic rhinitis 1 patient uses Flonase.  She notes no rhinorrhea or sneezing.  She has had postnasal drip for the last 1.5 weeks.  No congestion.  She also notes some headache at times over the last 1.5-week.  She describes it as an ache.  Often occurs at the back of her head and sometimes at the top of her head.  No sudden vision changes.  No numbness or weakness.  Social History   Tobacco Use  Smoking Status Never  Smokeless Tobacco Never    Current Outpatient Medications on File Prior to Visit  Medication Sig Dispense Refill   aspirin EC 81 MG tablet Take 81 mg by mouth daily.     fluticasone (FLONASE) 50 MCG/ACT nasal spray Use 2 spray(s) in each nostril once daily 16 g 0   ibuprofen (ADVIL) 200 MG tablet Take 200 mg by mouth every 6 (six) hours as needed.     metoprolol succinate (TOPROL-XL) 50 MG 24 hr tablet Take 1 tablet (50 mg total) by mouth daily. Take with or immediately following a meal. 90 tablet 1   Multiple Vitamins-Minerals (PRESERVISION/LUTEIN) CAPS Take 2 capsules by mouth daily.     triamterene-hydrochlorothiazide (MAXZIDE-25) 37.5-25 MG tablet TAKE 1/2 (ONE-HALF) TABLET BY MOUTH IN THE MORNING 45 tablet 3   No current facility-administered  medications on file prior to visit.     ROS see history of present illness  Objective  Physical Exam Vitals:   09/22/23 1018  BP: 122/76  Pulse: (!) 57  Temp: 97.9 F (36.6 C)  SpO2: 98%    BP Readings from Last 3 Encounters:  09/22/23 122/76  03/26/23 122/69  03/23/23 120/78   Wt Readings from Last 3 Encounters:  09/22/23 152 lb 9.6 oz (69.2 kg)  03/23/23 147 lb 3.2 oz (66.8 kg)  10/06/22 145 lb 9.6 oz (66 kg)    Physical Exam Constitutional:      General: She is not in acute distress.    Appearance: She is not diaphoretic.  HENT:     Right Ear: Tympanic membrane normal.     Left Ear: Tympanic membrane normal.  Cardiovascular:     Rate and Rhythm: Normal rate and regular rhythm.     Heart sounds: Normal heart sounds.  Pulmonary:     Effort: Pulmonary effort is normal.     Breath sounds: Normal breath sounds.  Skin:    General: Skin is warm and dry.  Neurological:     Mental Status: She is alert.     Comments: CN 3-12 intact, 5/5 strength in bilateral biceps, triceps, grip, quads, hamstrings, plantar and dorsiflexion, sensation to light touch intact in bilateral UE and LE, normal gait  Assessment/Plan: Please see individual problem list.  Benign essential HTN Assessment & Plan: Chronic issue.  Well-controlled.  Patient will continue metoprolol 50 mg daily and Maxzide half a tablet daily.  Check labs.  Orders: -     Comprehensive metabolic panel -     Lipid panel  Allergic rhinitis, unspecified seasonality, unspecified trigger Assessment & Plan: Chronic issue.  Suspect patient's allergies are flared up at this time given postnasal drip.  Discussed headache could be related to this.  Discussed the symptoms could also be related to a viral illness.  Will try adding Claritin 5 mg daily to see if that is beneficial.  If her headache and postnasal drip do not resolve with this she will let us know.  Orders: -     Claritin; Chew 1 tablet (5 mg total) by  mouth daily.  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  Patient's cardiologist retired.  She is requesting referral to a new cardiologist.  Orders: -     Ambulatory referral to Cardiology  Prediabetes -     Hemoglobin A1c  Encounter for administration of vaccine -     Flu Vaccine Trivalent High Dose (Fluad)    Return in about 6 months (around 03/22/2024) for transfer of care.   Marikay Alar, MD [facility]

## 2023-09-22 NOTE — Assessment & Plan Note (Signed)
Chronic issue.  Suspect patient's allergies are flared up at this time given postnasal drip.  Discussed headache could be related to this.  Discussed the symptoms could also be related to a viral illness.  Will try adding Claritin 5 mg daily to see if that is beneficial.  If her headache and postnasal drip do not resolve with this she will let us know.

## 2023-09-26 ENCOUNTER — Other Ambulatory Visit: Payer: Self-pay | Admitting: Family Medicine

## 2023-09-26 DIAGNOSIS — J302 Other seasonal allergic rhinitis: Secondary | ICD-10-CM

## 2023-09-29 ENCOUNTER — Telehealth: Payer: Self-pay | Admitting: Family Medicine

## 2023-09-29 NOTE — Telephone Encounter (Signed)
Patient walked in and was asking about her referral to a Cardiologist. She said his name is Clinical cytogeneticist. Her number is [phone]. She would like to be called once the referral is ready.

## 2023-09-29 NOTE — Telephone Encounter (Signed)
Cardiology referral was placed on 09/22/23.

## 2023-09-29 NOTE — Telephone Encounter (Signed)
Patient is scheduled for 10/01/23.

## 2023-09-29 NOTE — Addendum Note (Signed)
Addended by: Glori Luis on: 09/29/2023 08:19 AM   Modules accepted: Orders

## 2023-09-29 NOTE — Telephone Encounter (Signed)
Left message to call back regarding getting her scheduled for a lipid, A1c, and CMET that she was supposed to get after her last appointment but the labs was missed.

## 2023-09-29 NOTE — Telephone Encounter (Signed)
This patient was supposed to have lab work at her last visit.  It does not appear that this was done.  Can you please call her and get her scheduled for labs?  Thanks.

## 2023-09-29 NOTE — Telephone Encounter (Signed)
Patient is aware the Cardiology referral has been put in.

## 2023-10-01 ENCOUNTER — Other Ambulatory Visit (INDEPENDENT_AMBULATORY_CARE_PROVIDER_SITE_OTHER): Payer: PPO

## 2023-10-01 DIAGNOSIS — R7303 Prediabetes: Secondary | ICD-10-CM | POA: Diagnosis not present

## 2023-10-01 DIAGNOSIS — I10 Essential (primary) hypertension: Secondary | ICD-10-CM

## 2023-10-01 LAB — COMPREHENSIVE METABOLIC PANEL
ALT: 12 U/L (ref 0–35)
AST: 18 U/L (ref 0–37)
Albumin: 3.8 g/dL (ref 3.5–5.2)
Alkaline Phosphatase: 55 U/L (ref 39–117)
BUN: 19 mg/dL (ref 6–23)
CO2: 29 meq/L (ref 19–32)
Calcium: 9.3 mg/dL (ref 8.4–10.5)
Chloride: 101 meq/L (ref 96–112)
Creatinine, Ser: 0.9 mg/dL (ref 0.40–1.20)
GFR: 55.16 mL/min — ABNORMAL LOW (ref >60.00)
Glucose, Bld: 112 mg/dL — ABNORMAL HIGH (ref 70–99)
Potassium: 3.9 meq/L (ref 3.5–5.1)
Sodium: 138 meq/L (ref 135–145)
Total Bilirubin: 0.4 mg/dL (ref 0.2–1.2)
Total Protein: 6.2 g/dL (ref 6.0–8.3)

## 2023-10-01 LAB — LIPID PANEL
Cholesterol: 233 mg/dL — ABNORMAL HIGH (ref 0–200)
HDL: 54.1 mg/dL (ref >39.00)
LDL Cholesterol: 155 mg/dL — ABNORMAL HIGH (ref 0–99)
NonHDL: 179.3
Total CHOL/HDL Ratio: 4
Triglycerides: 120 mg/dL (ref 0.0–149.0)
VLDL: 24 mg/dL (ref 0.0–40.0)

## 2023-10-01 LAB — HEMOGLOBIN A1C: Hgb A1c MFr Bld: 5.9 % (ref 4.6–6.5)

## 2023-10-13 ENCOUNTER — Ambulatory Visit (INDEPENDENT_AMBULATORY_CARE_PROVIDER_SITE_OTHER): Payer: PPO | Admitting: *Deleted

## 2023-10-13 VITALS — BP 128/78 | HR 64 | Temp 97.0°F | Ht 64.0 in | Wt 152.0 lb

## 2023-10-13 DIAGNOSIS — Z0000 Encounter for general adult medical examination without abnormal findings: Secondary | ICD-10-CM

## 2023-10-13 NOTE — Progress Notes (Signed)
Subjective:   Jessica Reynolds is a 89 y.o. female who presents for Medicare Annual (Subsequent) preventive examination.  Visit Complete: In person   Cardiac Risk Factors include: advanced age (>48men, >56 women);dyslipidemia;hypertension;Other (see comment), Risk factor comments: Bradycardia     Objective:    Today's Vitals   10/13/23 0948  BP: 128/78  Pulse: 64  Temp: (!) 97 F (36.1 C)  TempSrc: Skin  SpO2: 96%  Weight: 152 lb (68.9 kg)  Height: 5\' 4"  (1.626 m)   Body mass index is 26.09 kg/m.     10/13/2023   10:05 AM 10/06/2022    3:13 PM 10/02/2021    2:19 PM 07/09/2020   12:56 PM 10/04/2019    9:15 AM 07/07/2019   12:53 PM 07/05/2018   10:07 AM  Advanced Directives  Does Patient Have a Medical Advance Directive? Yes Yes Yes Yes Yes Yes Yes  Type of Estate agent of [place];Living will Living will;Healthcare Power of [employer] Power of [employer] Power of [place];Living will Healthcare Power of [place];Living will Healthcare Power of [place];Living will Healthcare Power of Attorney  Does patient want to make changes to medical advance directive?  No - Patient declined No - Patient declined No - Patient declined No - Patient declined No - Patient declined No - Patient declined  Copy of Healthcare Power of Attorney in Chart? No - copy requested No - copy requested No - copy requested No - copy requested No - copy requested No - copy requested No - copy requested    Current Medications (verified) Outpatient Encounter Medications as of 10/13/2023  Medication Sig   aspirin EC 81 MG tablet Take 81 mg by mouth daily.   fluticasone (FLONASE) 50 MCG/ACT nasal spray Use 2 spray(s) in each nostril once daily   ibuprofen (ADVIL) 200 MG tablet Take 200 mg by mouth every 6 (six) hours as needed.   loratadine (CLARITIN) 5 MG chewable tablet Chew 1 tablet (5 mg total) by mouth daily.   metoprolol succinate (TOPROL-XL) 50 MG 24 hr  tablet Take 1 tablet (50 mg total) by mouth daily. Take with or immediately following a meal.   Multiple Vitamins-Minerals (PRESERVISION/LUTEIN) CAPS Take 2 capsules by mouth daily.   triamterene-hydrochlorothiazide (MAXZIDE-25) 37.5-25 MG tablet TAKE 1/2 (ONE-HALF) TABLET BY MOUTH IN THE MORNING   No facility-administered encounter medications on file as of 10/13/2023.    Allergies (verified) Celecoxib and Doxycycline   History: Past Medical History:  Diagnosis Date   Arrhythmia    Arthritis    shoulder   Colon polyp    Headache    migraines   HOH (hard of hearing)    wears aids   Hypercholesterolemia    Hypertension    Osteoarthritis    UTI (lower urinary tract infection)    Past Surgical History:  Procedure Laterality Date   ABDOMINAL HYSTERECTOMY  1980   BSO, fibroids   ACHILLES TENDON SURGERY Right 1990   ACHILLES TENDON SURGERY Left 1997   CATARACT EXTRACTION W/PHACO Right 09/13/2019   Procedure: CATARACT EXTRACTION PHACO AND INTRAOCULAR LENS PLACEMENT (IOC) RIGHT 01:01.4           11.2%           6.93;  Surgeon: Galen Manila, MD;  Location: [facility];  Service: Ophthalmology;  Laterality: Right;   CATARACT EXTRACTION W/PHACO Left 10/04/2019   Procedure: CATARACT EXTRACTION PHACO AND INTRAOCULAR LENS PLACEMENT (IOC) LEFT  00:53.5  15.5%  8.31;  Surgeon:  Galen Manila, MD;  Location: [facility];  Service: Ophthalmology;  Laterality: Left;   ECTOPIC PREGNANCY SURGERY  1960   VAGINAL DELIVERY     4   Family History  Problem Relation Age of Onset   Stroke Mother    Heart disease Father    Macular degeneration Sister    Diabetes Sister    Breast cancer Neg Hx    Social History   Socioeconomic History   Marital status: Widowed    Spouse name: Not on file   Number of children: Not on file   Years of education: Not on file   Highest education level: 12th grade  Occupational History   Not on file  Tobacco Use   Smoking status: Never    Smokeless tobacco: Never  Vaping Use   Vaping status: Never Used  Substance and Sexual Activity   Alcohol use: Yes    Comment: social   Drug use: No   Sexual activity: Not on file  Other Topics Concern   Not on file  Social History Narrative   Born in [place], [place].      Lives in [place] with son. Husband passed away Oct 30, 2012. No pets.      Work - retired for Scientist, research (physical sciences)      Diet - regular      Exercise - tennis,  3-5 days per week   Social Determinants of Health   Financial Resource Strain: Low Risk  (10/13/2023)   Overall Financial Resource Strain (CARDIA)    Difficulty of Paying Living Expenses: Not hard at all  Food Insecurity: No Food Insecurity (10/13/2023)   Hunger Vital Sign    Worried About Running Out of Food in the Last Year: Never true    Ran Out of Food in the Last Year: Never true  Transportation Needs: No Transportation Needs (10/13/2023)   PRAPARE - Administrator, Civil Service (Medical): No    Lack of Transportation (Non-Medical): No  Physical Activity: Inactive (10/13/2023)   Exercise Vital Sign    Days of Exercise per Week: 2 days    Minutes of Exercise per Session: 0 min  Stress: No Stress Concern Present (10/13/2023)   [employer] of Occupational Health - Occupational Stress Questionnaire    Feeling of Stress : Only a little  Social Connections: Moderately Isolated (10/13/2023)   Social Connection and Isolation Panel [NHANES]    Frequency of Communication with Friends and Family: More than three times a week    Frequency of Social Gatherings with Friends and Family: Once a week    Attends Religious Services: More than 4 times per year    Active Member of [employer] or Organizations: No    Attends Banker Meetings: Never    Marital Status: Widowed    Tobacco Counseling Counseling given: Not Answered   Clinical Intake:  Pre-visit preparation completed: Yes  Pain : No/denies pain     BMI -  recorded: 26.09 Nutritional Status: BMI 25 -29 Overweight Nutritional Risks: None Diabetes: No  How often do you need to have someone help you when you read instructions, pamphlets, or other written materials from your doctor or pharmacy?: 1 - Never  Interpreter Needed?: No  Information entered by :: R. Veralyn Lopp LPN   Activities of Daily Living    10/13/2023    9:54 AM  In your present state of health, do you have any difficulty performing the following activities:  Hearing? 1  Comment wears  aids  Vision? 1  Comment readers, has AMD  Difficulty concentrating or making decisions? 0  Walking or climbing stairs? 0  Dressing or bathing? 0  Doing errands, shopping? 0  Preparing Food and eating ? N  Using the Toilet? N  In the past six months, have you accidently leaked urine? Y  Comment wears pads  Do you have problems with loss of bowel control? N  Managing your Medications? N  Managing your Finances? N  Housekeeping or managing your Housekeeping? N    Patient Care Team: Glori Luis, MD as PCP - General (Family Medicine) Shelia Media, MD (Internal Medicine)  Indicate any recent Medical Services you may have received from other than [facility] providers in the past year (date may be approximate).     Assessment:   This is a routine wellness examination for Laiklyn.  Hearing/Vision screen Hearing Screening - Comments:: Wears aids Vision Screening - Comments:: readers   Goals Addressed             This Visit's Progress    Patient Stated       Wants to continue to maintain good health and take care of her handicap son       Depression Screen    10/13/2023   10:01 AM 09/22/2023   10:20 AM 03/23/2023   10:32 AM 10/06/2022    3:02 PM 09/19/2022   11:39 AM 06/16/2022    2:18 PM 04/16/2022    2:05 PM  PHQ 2/9 Scores  PHQ - 2 Score 0 0 0 0 0 0 0  PHQ- 9 Score 0 0 0        Fall Risk    10/13/2023    9:57 AM 09/22/2023   10:20 AM 03/23/2023   10:32 AM 10/06/2022     3:16 PM 09/19/2022   11:38 AM  Fall Risk   Falls in the past year? 0 0 0 0 0  Number falls in past yr: 0 0 0 0 0  Injury with Fall? 0 0 0 0 0  Risk for fall due to : No Fall Risks No Fall Risks No Fall Risks No Fall Risks No Fall Risks  Follow up Falls prevention discussed;Falls evaluation completed Falls evaluation completed Falls evaluation completed Falls evaluation completed Falls evaluation completed    MEDICARE RISK AT HOME: Medicare Risk at Home Any stairs in or around the home?: Yes If so, are there any without handrails?: No Home free of loose throw rugs in walkways, pet beds, electrical cords, etc?: Yes Adequate lighting in your home to reduce risk of falls?: Yes Life alert?: No Use of a cane, walker or w/c?: No Grab bars in the bathroom?: Yes Shower chair or bench in shower?: No Elevated toilet seat or a handicapped toilet?: Yes  TIMED UP AND GO:  Was the test performed?  Yes  Length of time to ambulate 10 feet: 8 sec Gait steady and fast without use of assistive device    Cognitive Function:        10/13/2023   10:06 AM 10/06/2022    3:20 PM 07/09/2020   12:51 PM 07/07/2019   12:52 PM 07/05/2018   10:08 AM  6CIT Screen  What Year? 0 points 0 points 0 points 0 points 0 points  What month? 0 points 0 points 0 points 0 points 0 points  What time? 0 points 0 points  0 points 0 points  Count back from 20 0 points 0 points  0  points 0 points  Months in reverse 0 points 0 points 0 points 0 points 0 points  Repeat phrase 0 points 0 points 4 points 0 points 0 points  Total Score 0 points 0 points  0 points 0 points    Immunizations Immunization History  Administered Date(s) Administered   Fluad Quad(high Dose 65+) 10/12/2019, 11/30/2020, 09/30/2021   Fluad Trivalent(High Dose 65+) 09/22/2023   Influenza Split 09/14/2014, 10/11/2014   Influenza, High Dose Seasonal PF 11/16/2017, 11/05/2018   Influenza,inj,Quad PF,6+ Mos 10/24/2015   Influenza-Unspecified  12/22/2016   PFIZER(Purple Top)SARS-COV-2 Vaccination 12/29/2019, 01/19/2020, 09/15/2020, 09/13/2021   Pfizer Covid-19 Vaccine Bivalent Booster 51yrs & up 09/14/2022   Pneumococcal Conjugate-13 01/28/2016   Pneumococcal Polysaccharide-23 12/15/1998, 03/12/2011   Td 01/18/2013   Zoster Recombinant(Shingrix) 04/06/2023, 06/23/2023   Zoster, Live 07/05/2007, 01/22/2009    TDAP status: Due, Education has been provided regarding the importance of this vaccine. Advised may receive this vaccine at local pharmacy or Health Dept. Aware to provide a copy of the vaccination record if obtained from local pharmacy or Health Dept. Verbalized acceptance and understanding.  Flu Vaccine status: Up to date  Pneumococcal vaccine status: Up to date  Covid-19 vaccine status: Information provided on how to obtain vaccines.   Qualifies for Shingles Vaccine? Yes   Zostavax completed Yes   Shingrix Completed?: Yes  Screening Tests Health Maintenance  Topic Date Due   DTaP/Tdap/Td (2 - Tdap) 01/18/2023   COVID-19 Vaccine (6 - 2023-24 season) 08/16/2023   Medicare Annual Wellness (AWV)  10/07/2023   Pneumonia Vaccine 66+ Years old  Completed   INFLUENZA VACCINE  Completed   DEXA SCAN  Completed   Zoster Vaccines- Shingrix  Completed   HPV VACCINES  Aged Out    Health Maintenance  Health Maintenance Due  Topic Date Due   DTaP/Tdap/Td (2 - Tdap) 01/18/2023   COVID-19 Vaccine (6 - 2023-24 season) 08/16/2023   Medicare Annual Wellness (AWV)  10/07/2023    Colorectal cancer screening: No longer required.   Mammogram status: No longer required due to age.  Bone Density status: Completed 08/2021. Results reflect: Bone density results: NORMAL. Repeat every 2 years.  Lung Cancer Screening: (Low Dose CT Chest recommended if Age 3-80 years, 20 pack-year currently smoking OR have quit w/in 15years.) does not qualify.    Additional Screening:  Hepatitis C Screening: does not qualify; Completed NA  age  Vision Screening: Recommended annual ophthalmology exams for early detection of glaucoma and other disorders of the eye. Is the patient up to date with their annual eye exam?  Yes  Who is the provider or what is the name of the office in which the patient attends annual eye exams? [facility] If pt is not established with a provider, would they like to be referred to a provider to establish care? No .   Dental Screening: Recommended annual dental exams for proper oral hygiene   Community Resource Referral / Chronic Care Management: CRR required this visit?  No   CCM required this visit?  No     Plan:     I have personally reviewed and noted the following in the patient's chart:   Medical and social history Use of alcohol, tobacco or illicit drugs  Current medications and supplements including opioid prescriptions. Patient is not currently taking opioid prescriptions. Functional ability and status Nutritional status Physical activity Advanced directives List of other physicians Hospitalizations, surgeries, and ER visits in previous 12 months Vitals Screenings to include cognitive,  depression, and falls Referrals and appointments  In addition, I have reviewed and discussed with patient certain preventive protocols, quality metrics, and best practice recommendations. A written personalized care plan for preventive services as well as general preventive health recommendations were provided to patient.     Sydell Axon, LPN   [id number]   After Visit Summary: (MyChart) Due to this being a telephonic visit, the after visit summary with patients personalized plan was offered to patient via MyChart   Nurse Notes: None

## 2023-10-13 NOTE — Patient Instructions (Signed)
Jessica Reynolds , Thank you for taking time to come for your Medicare Wellness Visit. I appreciate your ongoing commitment to your health goals. Please review the following plan we discussed and let me know if I can assist you in the future.   Referrals/Orders/Follow-Ups/Clinician Recommendations: Remember to update your Tetanus and Covid vaccines  This is a list of the screening recommended for you and due dates:  Health Maintenance  Topic Date Due   DTaP/Tdap/Td vaccine (2 - Tdap) 01/18/2023   COVID-19 Vaccine (6 - 2023-24 season) 08/16/2023   Medicare Annual Wellness Visit  10/12/2024   Pneumonia Vaccine  Completed   Flu Shot  Completed   DEXA scan (bone density measurement)  Completed   Zoster (Shingles) Vaccine  Completed   HPV Vaccine  Aged Out    Advanced directives: (Copy Requested) Please bring a copy of your health care power of attorney and living will to the office to be added to your chart at your convenience.  Next Medicare Annual Wellness Visit scheduled for next year: Yes 10/18/24 @ 9:35

## 2023-10-19 ENCOUNTER — Telehealth: Payer: Self-pay | Admitting: Family Medicine

## 2023-10-19 ENCOUNTER — Ambulatory Visit: Payer: PPO | Admitting: Family Medicine

## 2023-10-19 DIAGNOSIS — L299 Pruritus, unspecified: Secondary | ICD-10-CM

## 2023-10-19 NOTE — Telephone Encounter (Signed)
Patient just called back and asked can we cancel her appointment. She said she is having car problems and she will call us when she can to reschedule.

## 2023-10-19 NOTE — Telephone Encounter (Signed)
Patient needs a referral to [facility], Dr Sheran Spine.

## 2023-10-19 NOTE — Telephone Encounter (Signed)
Noted  

## 2023-10-19 NOTE — Telephone Encounter (Signed)
Scheduled for 11/04 at 3:40

## 2023-10-19 NOTE — Telephone Encounter (Signed)
Patient wears hearing aides and has had itching on outer ears x 3 days. Patient would like a referral to Dr. Sheran Spine at [facility].

## 2023-10-19 NOTE — Telephone Encounter (Signed)
I have placed the referral though she could always come see [place] for her ear itching given that it may take some time to get into see ENT.

## 2023-10-22 NOTE — Telephone Encounter (Signed)
Patient states she would like to let Dr. Marikay Alar know that her symptoms have eased up.  Patient states she would still like to schedule an appointment with Dr. Birdie Sons because she would like to know what the underlying cause is.  I scheduled appointment for patient to see Dr. Marikay Alar on 11/06/2023, which is his next available appointment.  Patient states her left leg is also itching, but she does not have a rash.  I included this in the appointment note.

## 2023-11-03 DIAGNOSIS — Z961 Presence of intraocular lens: Secondary | ICD-10-CM | POA: Diagnosis not present

## 2023-11-03 DIAGNOSIS — H353124 Nonexudative age-related macular degeneration, left eye, advanced atrophic with subfoveal involvement: Secondary | ICD-10-CM | POA: Diagnosis not present

## 2023-11-03 DIAGNOSIS — H353114 Nonexudative age-related macular degeneration, right eye, advanced atrophic with subfoveal involvement: Secondary | ICD-10-CM | POA: Diagnosis not present

## 2023-11-06 ENCOUNTER — Encounter: Payer: Self-pay | Admitting: Family Medicine

## 2023-11-06 ENCOUNTER — Ambulatory Visit (INDEPENDENT_AMBULATORY_CARE_PROVIDER_SITE_OTHER): Payer: PPO | Admitting: Family Medicine

## 2023-11-06 VITALS — BP 130/86 | HR 56 | Temp 97.6°F | Ht 64.0 in | Wt 152.0 lb

## 2023-11-06 DIAGNOSIS — R21 Rash and other nonspecific skin eruption: Secondary | ICD-10-CM | POA: Insufficient documentation

## 2023-11-06 DIAGNOSIS — L299 Pruritus, unspecified: Secondary | ICD-10-CM | POA: Diagnosis not present

## 2023-11-06 MED ORDER — KETOCONAZOLE 2 % EX CREA: 1.0000 | TOPICAL_CREAM | Freq: Every day | CUTANEOUS | 0 refills | Status: DC

## 2023-11-06 MED ORDER — TRIAMCINOLONE ACETONIDE 0.1 % EX CREA: 1.0000 | TOPICAL_CREAM | Freq: Two times a day (BID) | CUTANEOUS | 0 refills | Status: DC

## 2023-11-06 NOTE — Progress Notes (Signed)
  Marikay Alar, MD Phone: [phone]  Jessica Reynolds is a 89 y.o. female who presents today for same day visit.   Left ear itching: Patient notes left external ear itching that lasted for 3 to 4 days.  She describes it as very intense itching.  No rash.  Started a day or 2 after having an exam with her ENT physician.  She put topical hydrocortisone on it and it has resolved at this time.  Left foot itching: Patient notes itching along her left inner foot to her heel at times.  Notes this has been going on intermittently for a year.  Occasionally itches on her right heel.  She has not had any rash with this.  She tried hydrocortisone with little benefit.  Social History   Tobacco Use  Smoking Status Never  Smokeless Tobacco Never    Current Outpatient Medications on File Prior to Visit  Medication Sig Dispense Refill   aspirin EC 81 MG tablet Take 81 mg by mouth daily.     fluticasone (FLONASE) 50 MCG/ACT nasal spray Use 2 spray(s) in each nostril once daily 16 g 0   ibuprofen (ADVIL) 200 MG tablet Take 200 mg by mouth every 6 (six) hours as needed.     loratadine (CLARITIN) 5 MG chewable tablet Chew 1 tablet (5 mg total) by mouth daily. 30 tablet 3   metoprolol succinate (TOPROL-XL) 50 MG 24 hr tablet Take 1 tablet (50 mg total) by mouth daily. Take with or immediately following a meal. 90 tablet 1   Multiple Vitamins-Minerals (PRESERVISION/LUTEIN) CAPS Take 2 capsules by mouth daily.     triamterene-hydrochlorothiazide (MAXZIDE-25) 37.5-25 MG tablet TAKE 1/2 (ONE-HALF) TABLET BY MOUTH IN THE MORNING 45 tablet 3   No current facility-administered medications on file prior to visit.     ROS see history of present illness  Objective  Physical Exam Vitals:   11/06/23 0949  BP: 130/86  Pulse: (!) 56  Temp: 97.6 F (36.4 C)  SpO2: 99%    BP Readings from Last 3 Encounters:  11/06/23 130/86  10/13/23 128/78  09/22/23 122/76   Wt Readings from Last 3 Encounters:   11/06/23 152 lb (68.9 kg)  10/13/23 152 lb (68.9 kg)  09/22/23 152 lb 9.6 oz (69.2 kg)    Physical Exam HENT:     Left Ear: Tympanic membrane, ear canal and external ear normal.  Skin:    Comments: Occasional scaly patch of skin on left foot longitudinal arch, no significant erythema      Assessment/Plan: Please see individual problem list.  Rash of foot Assessment & Plan: Patient with scaly rash and itching on her left foot.  Possibly related to fungal skin infection.  We will start the patient on triamcinolone and ketoconazole topically.  If not improving over the next week or 2 she will let us know we can have her see dermatology.  Orders: -     Triamcinolone Acetonide; Apply 1 Application topically 2 (two) times daily. Do not use for more than 14 days  Dispense: 30 g; Refill: 0 -     Ketoconazole; Apply 1 Application topically daily.  Dispense: 15 g; Refill: 0  Ear itching Assessment & Plan: Undetermined cause.  Has resolved at this time.  If it recurs she can try the hydrocortisone on it again.      Return if symptoms worsen or fail to improve.   Marikay Alar, MD [facility]

## 2023-11-06 NOTE — Assessment & Plan Note (Signed)
Patient with scaly rash and itching on her left foot.  Possibly related to fungal skin infection.  We will start the patient on triamcinolone and ketoconazole topically.  If not improving over the next week or 2 she will let us know we can have her see dermatology.

## 2023-11-06 NOTE — Patient Instructions (Signed)
Nice to see you. I have sent in topical triamcinolone and topical ketoconazole for you to use for your foot rash and itching.  You should not use the triamcinolone for more than 2 weeks.  There is some risk of thinning of your skin and depigmentation of your skin if this is used for longer than 14 days.  If your symptoms are not improving in the next week or 2 please let me know and we can refer you to dermatology.

## 2023-11-06 NOTE — Assessment & Plan Note (Signed)
Undetermined cause.  Has resolved at this time.  If it recurs she can try the hydrocortisone on it again.

## 2023-11-23 DIAGNOSIS — H353114 Nonexudative age-related macular degeneration, right eye, advanced atrophic with subfoveal involvement: Secondary | ICD-10-CM | POA: Diagnosis not present

## 2023-11-23 DIAGNOSIS — Z961 Presence of intraocular lens: Secondary | ICD-10-CM | POA: Diagnosis not present

## 2023-11-23 DIAGNOSIS — H353124 Nonexudative age-related macular degeneration, left eye, advanced atrophic with subfoveal involvement: Secondary | ICD-10-CM | POA: Diagnosis not present

## 2023-12-06 NOTE — Progress Notes (Unsigned)
Cardiology Office Note  Date:  12/07/2023   ID:  Ardie, Mazzotti 11-21-1928, MRN [id number]  PCP:  Glori Luis, MD   Chief Complaint  Patient presents with   New Patient (Initial Visit)    Ref by Dr. Birdie Sons for PVC's. Former patient at [facility]. Patient c/o shortness of breath with extreme exertion and frequent PVC's. Medications reviewed by the patient verbally.     HPI:  Jessica Reynolds is a 89 year old woman with past medical history of SVT PVCs Essential hypertension Hyperlipidemia Prediabetes Movements referral from Dr. Birdie Sons for frequent PVCs Previously seen by [facility] cardiology  Recently seen by primary care September 22, 2023 Previously seen by [facility] cardiology for PVCs Presenting to establish care and follow-up for her PVCs Maintained on metoprolol succinate 50 daily  With over-exertion has more palpitations More when going to bed  One time ran out of metoprolol, developed a feeling of being tired  Put herself back on metoprolol  Denies orthostasis, no near-syncope  Denies significant shortness of breath Trace lower extremity edema resolves overnight Does her ADLs, shopping No regular exercise program  Echo November 2022 from outside facility NORMAL LEFT VENTRICULAR SYSTOLIC FUNCTION  NORMAL RIGHT VENTRICULAR SYSTOLIC FUNCTION  NO VALVULAR STENOSIS  MILD MR, TR, PR  EF 45-50%   Prior monitor October 2022 Normal carotid ultrasound August 2019  For hypertension is taking metoprolol, triamterene hydrochlorothiazide Blood pressure well-controlled  Total chol: 233, LDL 155 Previously on crestor  Has a handicap son that lives with her, he has history of seizures, smoked, cognitive issues He goes to work  EKG personally reviewed by myself on todays visit EKG Interpretation Date/Time:  Monday December 07 2023 10:51:24 EST Ventricular Rate:  52 PR Interval:  158 QRS Duration:  72 QT Interval:  418 QTC Calculation: 388 R  Axis:   17  Text Interpretation: Sinus bradycardia When compared with ECG of 09-Jan-2016 17:44, No significant change was found Confirmed by Julien Nordmann [phone]) on 12/07/2023 10:58:07 AM    PMH:   has a past medical history of Arrhythmia, Arthritis, Colon polyp, Headache, HOH (hard of hearing), Hypercholesterolemia, Hypertension, Osteoarthritis, and UTI (lower urinary tract infection).  PSH:    Past Surgical History:  Procedure Laterality Date   ABDOMINAL HYSTERECTOMY  1980   BSO, fibroids   ACHILLES TENDON SURGERY Right 1990   ACHILLES TENDON SURGERY Left 1997   CATARACT EXTRACTION W/PHACO Right 09/13/2019   Procedure: CATARACT EXTRACTION PHACO AND INTRAOCULAR LENS PLACEMENT (IOC) RIGHT 01:01.4           11.2%           6.93;  Surgeon: Galen Manila, MD;  Location: [facility];  Service: Ophthalmology;  Laterality: Right;   CATARACT EXTRACTION W/PHACO Left 10/04/2019   Procedure: CATARACT EXTRACTION PHACO AND INTRAOCULAR LENS PLACEMENT (IOC) LEFT  00:53.5  15.5%  8.31;  Surgeon: Galen Manila, MD;  Location: [facility];  Service: Ophthalmology;  Laterality: Left;   ECTOPIC PREGNANCY SURGERY  1960   VAGINAL DELIVERY     4    Current Outpatient Medications  Medication Sig Dispense Refill   aspirin EC 81 MG tablet Take 81 mg by mouth daily.     fluticasone (FLONASE) 50 MCG/ACT nasal spray Use 2 spray(s) in each nostril once daily 16 g 0   ibuprofen (ADVIL) 200 MG tablet Take 200 mg by mouth every 6 (six) hours as needed.     ketoconazole (NIZORAL) 2 % cream Apply 1 Application  topically daily. 15 g 0   loratadine (CLARITIN) 5 MG chewable tablet Chew 1 tablet (5 mg total) by mouth daily. 30 tablet 3   metoprolol succinate (TOPROL-XL) 50 MG 24 hr tablet Take 1 tablet (50 mg total) by mouth daily. Take with or immediately following a meal. 90 tablet 1   Multiple Vitamins-Minerals (PRESERVISION/LUTEIN) CAPS Take 2 capsules by mouth daily.     triamcinolone cream  (KENALOG) 0.1 % Apply 1 Application topically 2 (two) times daily. Do not use for more than 14 days 30 g 0   triamterene-hydrochlorothiazide (MAXZIDE-25) 37.5-25 MG tablet TAKE 1/2 (ONE-HALF) TABLET BY MOUTH IN THE MORNING 45 tablet 3   No current facility-administered medications for this visit.    Allergies:   Celecoxib and Doxycycline   Social History:  The patient  reports that she has never smoked. She has never used smokeless tobacco. She reports current alcohol use. She reports that she does not use drugs.   Family History:   family history includes Diabetes in her sister; Heart disease in her father; Macular degeneration in her sister; Stroke in her mother.    Review of Systems: Review of Systems  Constitutional: Negative.   HENT: Negative.    Respiratory: Negative.    Cardiovascular: Negative.   Gastrointestinal: Negative.   Musculoskeletal: Negative.   Neurological: Negative.   Psychiatric/Behavioral: Negative.    All other systems reviewed and are negative.    PHYSICAL EXAM: VS:  BP 108/60 (BP Location: Right Arm, Patient Position: Sitting, Cuff Size: Normal)   Pulse (!) 52   Ht 5\' 4"  (1.626 m)   Wt 152 lb (68.9 kg)   SpO2 98%   BMI 26.09 kg/m  , BMI Body mass index is 26.09 kg/m.   Recent Labs: 10/01/2023: ALT 12; BUN 19; Creatinine, Ser 0.90; Potassium 3.9; Sodium 138    Lipid Panel Lab Results  Component Value Date   CHOL 233 (H) 10/01/2023   HDL 54.10 10/01/2023   LDLCALC 155 (H) 10/01/2023   TRIG 120.0 10/01/2023      Wt Readings from Last 3 Encounters:  12/07/23 152 lb (68.9 kg)  11/06/23 152 lb (68.9 kg)  10/13/23 152 lb (68.9 kg)     ASSESSMENT AND PLAN:  Problem List Items Addressed This Visit       Cardiology Problems   Benign essential HTN (Chronic)   Relevant Orders   EKG 12-Lead (Completed)   Frequent PVCs   Relevant Orders   EKG 12-Lead (Completed)   PSVT (paroxysmal supraventricular tachycardia) (HCC) - Primary    Relevant Orders   EKG 12-Lead (Completed)   HLD (hyperlipidemia)   PVCs Relatively well-controlled on metoprolol succinate 50 mg in the morning Occasional breakthrough palpitations at nighttime when trying to go to sleep Asymptomatic sinus bradycardia, will monitor for now For symptoms may need to decrease metoprolol succinate in half Blood pressure stable, denies orthostasis  Essential hypertension Blood pressure is well controlled on today's visit. No changes made to the medications.  Paroxysmal SVT Denies significant tachycardia episodes on the metoprolol Recommend she continue succinate 50 daily  Hyperlipidemia Previously on Crestor, reports it was stopped as her numbers got better denies side effects Recommend she restart Crestor 10 daily Total cholesterol 230s LDL 150s     Signed, Dossie Arbour, M.D., Ph.D. [facility] [place], [place] [phone]

## 2023-12-07 ENCOUNTER — Ambulatory Visit: Payer: PPO | Attending: Cardiovascular Disease | Admitting: Cardiovascular Disease

## 2023-12-07 ENCOUNTER — Encounter: Payer: Self-pay | Admitting: Cardiovascular Disease

## 2023-12-07 VITALS — BP 108/60 | HR 52 | Ht 64.0 in | Wt 152.0 lb

## 2023-12-07 DIAGNOSIS — I10 Essential (primary) hypertension: Secondary | ICD-10-CM | POA: Diagnosis not present

## 2023-12-07 DIAGNOSIS — E782 Mixed hyperlipidemia: Secondary | ICD-10-CM

## 2023-12-07 DIAGNOSIS — I471 Supraventricular tachycardia, unspecified: Secondary | ICD-10-CM

## 2023-12-07 DIAGNOSIS — I493 Ventricular premature depolarization: Secondary | ICD-10-CM

## 2023-12-07 MED ORDER — ROSUVASTATIN CALCIUM 10 MG PO TABS: 10.0000 mg | ORAL_TABLET | Freq: Every day | ORAL | 3 refills | Status: DC

## 2023-12-07 MED ORDER — METOPROLOL SUCCINATE ER 50 MG PO TB24: 50.0000 mg | ORAL_TABLET | Freq: Every day | ORAL | 3 refills | Status: AC

## 2023-12-07 NOTE — Patient Instructions (Signed)
Medication Instructions:  Please start crestor 10 mg daily  If you need a refill on your cardiac medications before your next appointment, please call your pharmacy.   Lab work: No new labs needed  Testing/Procedures: No new testing needed  Follow-Up: At [facility], you and your health needs are our priority.  As part of our continuing mission to provide you with exceptional heart care, we have created designated Provider Care Teams.  These Care Teams include your primary Cardiologist (physician) and Advanced Practice Providers (APPs -  Physician Assistants and Nurse Practitioners) who all work together to provide you with the care you need, when you need it.  You will need a follow up appointment in 12 months  Providers on your designated Care Team:   Nicolasa Ducking, NP Eula Listen, PA-C Cadence Fransico Michael, [place]  COVID-19 Vaccine Information can be found at: [email or web address] For questions related to vaccine distribution or appointments, please email [email or web address] or call [phone].

## 2024-01-04 DIAGNOSIS — H353114 Nonexudative age-related macular degeneration, right eye, advanced atrophic with subfoveal involvement: Secondary | ICD-10-CM | POA: Diagnosis not present

## 2024-02-18 ENCOUNTER — Other Ambulatory Visit: Payer: Self-pay

## 2024-02-18 DIAGNOSIS — I10 Essential (primary) hypertension: Secondary | ICD-10-CM

## 2024-02-18 MED ORDER — TRIAMTERENE-HCTZ 37.5-25 MG PO TABS: ORAL_TABLET | ORAL | 3 refills | Status: DC

## 2024-03-23 ENCOUNTER — Encounter: Payer: PPO | Admitting: Family Medicine

## 2024-03-31 ENCOUNTER — Encounter: Payer: Self-pay | Admitting: Dermatology

## 2024-03-31 ENCOUNTER — Ambulatory Visit: Payer: PPO | Admitting: Dermatology

## 2024-03-31 DIAGNOSIS — L820 Inflamed seborrheic keratosis: Secondary | ICD-10-CM | POA: Diagnosis not present

## 2024-03-31 DIAGNOSIS — W908XXA Exposure to other nonionizing radiation, initial encounter: Secondary | ICD-10-CM | POA: Diagnosis not present

## 2024-03-31 DIAGNOSIS — L570 Actinic keratosis: Secondary | ICD-10-CM

## 2024-03-31 DIAGNOSIS — L821 Other seborrheic keratosis: Secondary | ICD-10-CM | POA: Diagnosis not present

## 2024-03-31 DIAGNOSIS — L578 Other skin changes due to chronic exposure to nonionizing radiation: Secondary | ICD-10-CM

## 2024-03-31 NOTE — Patient Instructions (Signed)

## 2024-03-31 NOTE — Progress Notes (Signed)
 Follow-Up Visit   Subjective  Jessica Reynolds is a 89 y.o. female who presents for the following: AK and ISK follow up. Patient does have a spot at left face The patient has spots, moles and lesions to be evaluated, some may be new or changing and the patient may have concern these could be cancer.  The following portions of the chart were reviewed this encounter and updated as appropriate: medications, allergies, medical history  Review of Systems:  No other skin or systemic complaints except as noted in HPI or Assessment and Plan.  Objective  Well appearing patient in no apparent distress; mood and affect are within normal limits.  A focused examination was performed of the following areas: Face, arms  Relevant exam findings are noted in the Assessment and Plan.  L Preauricular x 2, R cheek x 1 (3) Erythematous thin papules/macules with gritty scale.  face x 16 (16) Erythematous stuck-on, waxy papule or plaque  Assessment & Plan   ACTINIC DAMAGE - chronic, secondary to cumulative UV radiation exposure/sun exposure over time - diffuse scaly erythematous macules with underlying dyspigmentation - Recommend daily broad spectrum sunscreen SPF 30+ to sun-exposed areas, reapply every 2 hours as needed.  - Recommend staying in the shade or wearing long sleeves, sun glasses (UVA+UVB protection) and wide brim hats (4-inch brim around the entire circumference of the hat). - Call for new or changing lesions.  SEBORRHEIC KERATOSIS - Stuck-on, waxy, tan-brown papules and/or plaques  - Benign-appearing - Discussed benign etiology and prognosis. - Observe - Call for any changes  AK (ACTINIC KERATOSIS) (3) L Preauricular x 2, R cheek x 1 (3) Actinic keratoses are precancerous spots that appear secondary to cumulative UV radiation exposure/sun exposure over time. They are chronic with expected duration over 1 year. A portion of actinic keratoses will progress to squamous cell carcinoma  of the skin. It is not possible to reliably predict which spots will progress to skin cancer and so treatment is recommended to prevent development of skin cancer.  Recommend daily broad spectrum sunscreen SPF 30+ to sun-exposed areas, reapply every 2 hours as needed.  Recommend staying in the shade or wearing long sleeves, sun glasses (UVA+UVB protection) and wide brim hats (4-inch brim around the entire circumference of the hat). Call for new or changing lesions. Destruction of lesion - L Preauricular x 2, R cheek x 1 (3) Complexity: simple   Destruction method: cryotherapy   Informed consent: discussed and consent obtained   Timeout:  patient name, date of birth, surgical site, and procedure verified Lesion destroyed using liquid nitrogen: Yes   Region frozen until ice ball extended beyond lesion: Yes   Outcome: patient tolerated procedure well with no complications   Post-procedure details: wound care instructions given   INFLAMED SEBORRHEIC KERATOSIS (16) face x 16 (16) Symptomatic, irritating, patient would like treated.  Benign-appearing.  Call clinic for new or changing lesions.   Destruction of lesion - face x 16 (16) Complexity: simple   Destruction method: cryotherapy   Informed consent: discussed and consent obtained   Timeout:  patient name, date of birth, surgical site, and procedure verified Lesion destroyed using liquid nitrogen: Yes   Region frozen until ice ball extended beyond lesion: Yes   Outcome: patient tolerated procedure well with no complications   Post-procedure details: wound care instructions given    Return in about 4 months (around 07/31/2024) for AK follow up.  Anise Salvo, RMA, am acting as Neurosurgeon for [employer]  Bary Likes, MD .   Documentation: I have reviewed the above documentation for accuracy and completeness, and I agree with the above.  Celine Collard, MD

## 2024-04-04 DIAGNOSIS — H353124 Nonexudative age-related macular degeneration, left eye, advanced atrophic with subfoveal involvement: Secondary | ICD-10-CM | POA: Diagnosis not present

## 2024-04-04 DIAGNOSIS — H353114 Nonexudative age-related macular degeneration, right eye, advanced atrophic with subfoveal involvement: Secondary | ICD-10-CM | POA: Diagnosis not present

## 2024-04-19 ENCOUNTER — Ambulatory Visit (INDEPENDENT_AMBULATORY_CARE_PROVIDER_SITE_OTHER)

## 2024-04-19 VITALS — BP 136/64 | HR 56 | Temp 97.7°F | Ht 64.0 in | Wt 159.2 lb

## 2024-04-19 DIAGNOSIS — H353230 Exudative age-related macular degeneration, bilateral, stage unspecified: Secondary | ICD-10-CM | POA: Diagnosis not present

## 2024-04-19 DIAGNOSIS — Z780 Asymptomatic menopausal state: Secondary | ICD-10-CM | POA: Insufficient documentation

## 2024-04-19 DIAGNOSIS — I10 Essential (primary) hypertension: Secondary | ICD-10-CM

## 2024-04-19 DIAGNOSIS — R7303 Prediabetes: Secondary | ICD-10-CM | POA: Diagnosis not present

## 2024-04-19 DIAGNOSIS — E782 Mixed hyperlipidemia: Secondary | ICD-10-CM | POA: Diagnosis not present

## 2024-04-19 DIAGNOSIS — E559 Vitamin D deficiency, unspecified: Secondary | ICD-10-CM

## 2024-04-19 DIAGNOSIS — J301 Allergic rhinitis due to pollen: Secondary | ICD-10-CM

## 2024-04-19 DIAGNOSIS — N3941 Urge incontinence: Secondary | ICD-10-CM

## 2024-04-19 DIAGNOSIS — M8580 Other specified disorders of bone density and structure, unspecified site: Secondary | ICD-10-CM | POA: Diagnosis not present

## 2024-04-19 HISTORY — DX: Vitamin D deficiency, unspecified: E55.9

## 2024-04-19 LAB — LIPID PANEL
Cholesterol: 175 mg/dL (ref 0–200)
HDL: 48.8 mg/dL (ref >39.00)
LDL Cholesterol: 97 mg/dL (ref 0–99)
NonHDL: 126.01
Total CHOL/HDL Ratio: 4
Triglycerides: 147 mg/dL (ref 0.0–149.0)
VLDL: 29.4 mg/dL (ref 0.0–40.0)

## 2024-04-19 LAB — COMPREHENSIVE METABOLIC PANEL WITH GFR
ALT: 12 U/L (ref 0–35)
AST: 18 U/L (ref 0–37)
Albumin: 4 g/dL (ref 3.5–5.2)
Alkaline Phosphatase: 48 U/L (ref 39–117)
BUN: 18 mg/dL (ref 6–23)
CO2: 28 meq/L (ref 19–32)
Calcium: 9.1 mg/dL (ref 8.4–10.5)
Chloride: 104 meq/L (ref 96–112)
Creatinine, Ser: 0.91 mg/dL (ref 0.40–1.20)
GFR: 54.22 mL/min — ABNORMAL LOW (ref >60.00)
Glucose, Bld: 92 mg/dL (ref 70–99)
Potassium: 4.1 meq/L (ref 3.5–5.1)
Sodium: 140 meq/L (ref 135–145)
Total Bilirubin: 0.4 mg/dL (ref 0.2–1.2)
Total Protein: 6.3 g/dL (ref 6.0–8.3)

## 2024-04-19 LAB — VITAMIN D 25 HYDROXY (VIT D DEFICIENCY, FRACTURES): VITD: 24.38 ng/mL — ABNORMAL LOW (ref 30.00–100.00)

## 2024-04-19 LAB — HEMOGLOBIN A1C: Hgb A1c MFr Bld: 6 % (ref 4.6–6.5)

## 2024-04-19 MED ORDER — ROSUVASTATIN CALCIUM 5 MG PO TABS: ORAL_TABLET | ORAL | 1 refills | Status: DC

## 2024-04-19 MED ORDER — TRIAMTERENE-HCTZ 37.5-25 MG PO TABS: ORAL_TABLET | ORAL | 2 refills | Status: AC

## 2024-04-19 NOTE — Assessment & Plan Note (Signed)
 Chronic, check lipid panel. Was started on Crestor  10 mg daily by cardiology. She reports she developed muscle aches and started to take Crestor  5 mg every other day which she has been tolerating well. Recommend continuing Crestor  5 mg, every other day. Refill sent

## 2024-04-19 NOTE — Progress Notes (Signed)
 Established Patient Office Visit   Subjective  Patient ID: Jessica Reynolds, female    DOB: [birth year]  Age: 89 y.o. MRN: [id number]  Chief Complaint  Patient presents with   Transitions Of Care    She  has a past medical history of Arrhythmia, Arthritis, Colon polyp, Headache, HOH (hard of hearing), Hypercholesterolemia, Hypertension, Macular degeneration, Osteoarthritis, and UTI (lower urinary tract infection).  HPI Established patient of Dr. Lovetta Rucks, last OV with him was on 11/06/23. 1) Chronic right knee pain: Ongoing for a year and a half. Got corticosteroid injection in 09/01/23 with Dr. Francina Irish. Has f/u with him in one week. Takes Ibuprofen 200 mg prn if pain is significant. Has been icing the right knee. No new concern today.    2) HTN: On Triamterene -hydrochlorothiazide 37.5-25 mg daily. Denies chest pain. Does not check BP at home.    3) H/O PVCs: Saw Dr. Gollan with cardiology on 12/07/23. On Metoprolol  succinate 50 mg, denies new concerns.   4) Hyperlipidemia: On Crestor  5 mg, every other day. Patient was recommended to take Crestor  10 mg every day but developed muscle aches/pain. With 5 mg every other day she is tolerating Crestor .   5) She takes Aspirin 81 mg for years now. She was told by her cardiologist in the past she will benefit from being on Aspirin.   6) Urinary urge incontinence: Has been wearing pads for years. She denies urinary frequency, urgency, dysuria, flank pain. She has not done pelvic floor therapy.   7) She lives at home. She has 4 kids who are great support to her.   8) Bone density from 06/17/2027: Femur neck right, osteopenic. Not currently taking vitamin D .   ROS As per HPI    Objective:     BP 136/64   Pulse (!) 56   Temp 97.7 F (36.5 C) (Oral)   Ht 5\' 4"  (1.626 m)   Wt 159 lb 3.2 oz (72.2 kg)   SpO2 96%   BMI 27.33 kg/m      11/06/2023    9:50 AM 10/13/2023   10:01 AM 09/22/2023   10:20 AM  Depression screen PHQ 2/9   Decreased Interest 0 0 0  Down, Depressed, Hopeless 0 0 0  PHQ - 2 Score 0 0 0  Altered sleeping 0 0 0  Tired, decreased energy 0 0 0  Change in appetite 0 0 0  Feeling bad or failure about yourself  0 0 0  Trouble concentrating 0 0 0  Moving slowly or fidgety/restless 0 0 0  Suicidal thoughts 0 0 0  PHQ-9 Score 0 0 0  Difficult doing work/chores Not difficult at all Not difficult at all Not difficult at all      11/06/2023    9:51 AM 09/22/2023   10:20 AM 03/23/2023   10:32 AM 06/16/2022    2:18 PM  GAD 7 : Generalized Anxiety Score  Nervous, Anxious, on Edge 0 0 0 0  Control/stop worrying 0 0 0 0  Worry too much - different things 0 0 0 0  Trouble relaxing 0 0 0 0  Restless 0 0 0 0  Easily annoyed or irritable 0 0 0 0  Afraid - awful might happen 0 0 0 0  Total GAD 7 Score 0 0 0 0  Anxiety Difficulty Not difficult at all Not difficult at all Not difficult at all Not difficult at all    Physical Exam Constitutional:      Appearance: Normal  appearance.  HENT:     Head: Normocephalic and atraumatic.     Nose: No congestion.     Mouth/Throat:     Mouth: Mucous membranes are moist.  Eyes:     Pupils: Pupils are equal, round, and reactive to light.  Neck:     Thyroid : No thyroid  mass or thyroid  tenderness.  Cardiovascular:     Rate and Rhythm: Normal rate.  Pulmonary:     Effort: Pulmonary effort is normal.     Breath sounds: Normal breath sounds.  Abdominal:     General: Bowel sounds are normal.     Palpations: Abdomen is soft.  Musculoskeletal:     Cervical back: Neck supple. No rigidity.     Right lower leg: No edema.     Left lower leg: No edema.  Skin:    General: Skin is warm.  Neurological:     Mental Status: She is alert and oriented to person, place, and time.  Psychiatric:        Mood and Affect: Mood normal.        Behavior: Behavior normal.        No results found for any visits on 04/19/24.  The ASCVD Risk score (Arnett DK, et al., 2019)  failed to calculate for the following reasons:   The 2019 ASCVD risk score is only valid for ages 7 to 65    Assessment & Plan:  Benign essential HTN Assessment & Plan: Chronic issue.  Well-controlled.  Patient will continue metoprolol  50 mg daily and Triamterene -hydrochlorothiazide 37.5-25 mg half a tablet daily.  We will check CMP today. Refill sent  Orders: -     Comprehensive metabolic panel with GFR -     Triamterene -HCTZ; TAKE 1/2 (ONE-HALF) TABLET BY MOUTH IN THE MORNING  Dispense: 90 tablet; Refill: 2  Mixed hyperlipidemia Assessment & Plan: Chronic, check lipid panel. Was started on Crestor  10 mg daily by cardiology. She reports she developed muscle aches and started to take Crestor  5 mg every other day which she has been tolerating well. Recommend continuing Crestor  5 mg, every other day. Refill sent  Orders: -     Lipid panel -     Rosuvastatin  Calcium ; Take 5 mg, every other day.  Dispense: 90 tablet; Refill: 1  Prediabetes Assessment & Plan: Check A1c.  Discussed remaining active with exercises patient can tolerate given OA of right knee.   Orders: -     Hemoglobin A1c  Osteopenia after menopause Assessment & Plan: Based upon bone density result from 06/17/2027 which showed, osteopenia of femur neck right side. She is currently not taking vitamin D . Recommend checking vitamin D  level. Management pending result.   Orders: -     VITAMIN D  25 Hydroxy (Vit-D Deficiency, Fractures)  Seasonal allergic rhinitis due to pollen Assessment & Plan: Not using OTC antihistamine, symptoms stable on no medications now.    Bilateral exudative age-related macular degeneration, unspecified stage (HCC) Assessment & Plan: Gets b/l intravitreal injection with ophthalmology. Continue management per ophthalmology.    Urge incontinence Assessment & Plan: Chronic issue.  Discussed timed voiding, Kegel exercise, pelvic floor strengthening therapy. Printed information on kegel  exercises provided. Patient declined PT referral and she will monitor symptoms.     Return in about 6 months (around 10/20/2024) for Chronic follow up.   Jacklin Mascot, MD

## 2024-04-19 NOTE — Assessment & Plan Note (Signed)
 Chronic issue.  Discussed timed voiding, Kegel exercise, pelvic floor strengthening therapy. Printed information on kegel exercises provided. Patient declined PT referral and she will monitor symptoms.

## 2024-04-19 NOTE — Assessment & Plan Note (Signed)
 Check A1c.  Discussed remaining active with exercises patient can tolerate given OA of right knee.

## 2024-04-19 NOTE — Assessment & Plan Note (Signed)
 Gets b/l intravitreal injection with ophthalmology. Continue management per ophthalmology.

## 2024-04-19 NOTE — Assessment & Plan Note (Signed)
 Not using OTC antihistamine, symptoms stable on no medications now.

## 2024-04-19 NOTE — Assessment & Plan Note (Signed)
 Based upon bone density result from 06/17/2027 which showed, osteopenia of femur neck right side. She is currently not taking vitamin D . Recommend checking vitamin D  level. Management pending result.

## 2024-04-19 NOTE — Progress Notes (Signed)
 Please let the patient know I reviewed her results. Her kidney function looks stable, cholesterol level looks good so she should continue taking Crestor  5 mg every other day. Her HbA1c or 3 months of average blood glucose looks good. She does have vitamin D  insuffiencey, I recommend she take over the counter Vitamin D  supplement 2,000 U daily which she can take it over the counter.   Thank you,  Jacklin Mascot, MD

## 2024-04-19 NOTE — Assessment & Plan Note (Signed)
 Chronic issue.  Well-controlled.  Patient will continue metoprolol  50 mg daily and Triamterene -hydrochlorothiazide 37.5-25 mg half a tablet daily.  We will check CMP today. Refill sent

## 2024-04-26 DIAGNOSIS — M25561 Pain in right knee: Secondary | ICD-10-CM | POA: Diagnosis not present

## 2024-05-03 DIAGNOSIS — H353124 Nonexudative age-related macular degeneration, left eye, advanced atrophic with subfoveal involvement: Secondary | ICD-10-CM | POA: Diagnosis not present

## 2024-05-03 DIAGNOSIS — H353114 Nonexudative age-related macular degeneration, right eye, advanced atrophic with subfoveal involvement: Secondary | ICD-10-CM | POA: Diagnosis not present

## 2024-05-03 DIAGNOSIS — Z961 Presence of intraocular lens: Secondary | ICD-10-CM | POA: Diagnosis not present

## 2024-05-03 DIAGNOSIS — H26493 Other secondary cataract, bilateral: Secondary | ICD-10-CM | POA: Diagnosis not present

## 2024-05-30 DIAGNOSIS — H353124 Nonexudative age-related macular degeneration, left eye, advanced atrophic with subfoveal involvement: Secondary | ICD-10-CM | POA: Diagnosis not present

## 2024-05-30 DIAGNOSIS — H353114 Nonexudative age-related macular degeneration, right eye, advanced atrophic with subfoveal involvement: Secondary | ICD-10-CM | POA: Diagnosis not present

## 2024-06-01 DIAGNOSIS — Z03818 Encounter for observation for suspected exposure to other biological agents ruled out: Secondary | ICD-10-CM | POA: Diagnosis not present

## 2024-07-25 DIAGNOSIS — H353114 Nonexudative age-related macular degeneration, right eye, advanced atrophic with subfoveal involvement: Secondary | ICD-10-CM | POA: Diagnosis not present

## 2024-07-25 DIAGNOSIS — H353133 Nonexudative age-related macular degeneration, bilateral, advanced atrophic without subfoveal involvement: Secondary | ICD-10-CM | POA: Diagnosis not present

## 2024-08-02 ENCOUNTER — Encounter: Payer: Self-pay | Admitting: Dermatology

## 2024-08-02 ENCOUNTER — Ambulatory Visit: Admitting: Dermatology

## 2024-08-02 DIAGNOSIS — L570 Actinic keratosis: Secondary | ICD-10-CM | POA: Diagnosis not present

## 2024-08-02 DIAGNOSIS — W908XXA Exposure to other nonionizing radiation, initial encounter: Secondary | ICD-10-CM

## 2024-08-02 DIAGNOSIS — L814 Other melanin hyperpigmentation: Secondary | ICD-10-CM | POA: Diagnosis not present

## 2024-08-02 DIAGNOSIS — L821 Other seborrheic keratosis: Secondary | ICD-10-CM | POA: Diagnosis not present

## 2024-08-02 DIAGNOSIS — L578 Other skin changes due to chronic exposure to nonionizing radiation: Secondary | ICD-10-CM | POA: Diagnosis not present

## 2024-08-02 DIAGNOSIS — L820 Inflamed seborrheic keratosis: Secondary | ICD-10-CM

## 2024-08-02 NOTE — Patient Instructions (Signed)

## 2024-08-02 NOTE — Progress Notes (Signed)
 Follow-Up Visit   Subjective  Jessica Reynolds is a 89 y.o. female who presents for the following: Actinic keratosis.  4 month follow up. Face. Tx with LN2 at last visit.   The patient has spots, moles and lesions to be evaluated, some may be new or changing and the patient may have concern these could be cancer.  The following portions of the chart were reviewed this encounter and updated as appropriate: medications, allergies, medical history  Review of Systems:  No other skin or systemic complaints except as noted in HPI or Assessment and Plan.  Objective  Well appearing patient in no apparent distress; mood and affect are within normal limits.  A focused examination was performed of the following areas: Face   Relevant exam findings are noted in the Assessment and Plan.  L proximal mandible x2 (2) Erythematous keratotic or waxy stuck-on papule or plaque. Nose x2, R ear mid antehelix (with CNCH) x1 (3) Erythematous thin papules/macules with gritty scale.   Assessment & Plan   SEBORRHEIC KERATOSIS - Stuck-on, waxy, tan-brown papules and/or plaques at face - Benign-appearing - Discussed benign etiology and prognosis. - Observe - Call for any changes  LENTIGINES Exam: scattered tan macules Due to sun exposure Treatment Plan: Benign-appearing, observe. Recommend daily broad spectrum sunscreen SPF 30+ to sun-exposed areas, reapply every 2 hours as needed.  Call for any changes  INFLAMED SEBORRHEIC KERATOSIS (2) L proximal mandible x2 (2) Symptomatic, irritating, patient would like treated. Destruction of lesion - L proximal mandible x2 (2) Complexity: simple   Destruction method: cryotherapy   Informed consent: discussed and consent obtained   Timeout:  patient name, date of birth, surgical site, and procedure verified Lesion destroyed using liquid nitrogen: Yes   Region frozen until ice ball extended beyond lesion: Yes   Outcome: patient tolerated procedure well  with no complications   Post-procedure details: wound care instructions given   Additional details:  Prior to procedure, discussed risks of blister formation, small wound, skin dyspigmentation, or rare scar following cryotherapy. Recommend Vaseline ointment to treated areas while healing.   AK (ACTINIC KERATOSIS) (3) Nose x2, R ear mid antehelix (with CNCH) x1 (3) Actinic keratoses are precancerous spots that appear secondary to cumulative UV radiation exposure/sun exposure over time. They are chronic with expected duration over 1 year. A portion of actinic keratoses will progress to squamous cell carcinoma of the skin. It is not possible to reliably predict which spots will progress to skin cancer and so treatment is recommended to prevent development of skin cancer.  Recommend daily broad spectrum sunscreen SPF 30+ to sun-exposed areas, reapply every 2 hours as needed.  Recommend staying in the shade or wearing long sleeves, sun glasses (UVA+UVB protection) and wide brim hats (4-inch brim around the entire circumference of the hat). Call for new or changing lesions. Destruction of lesion - Nose x2, R ear mid antehelix (with CNCH) x1 (3) Complexity: simple   Destruction method: cryotherapy   Informed consent: discussed and consent obtained   Timeout:  patient name, date of birth, surgical site, and procedure verified Lesion destroyed using liquid nitrogen: Yes   Region frozen until ice ball extended beyond lesion: Yes   Outcome: patient tolerated procedure well with no complications   Post-procedure details: wound care instructions given   Additional details:  Prior to procedure, discussed risks of blister formation, small wound, skin dyspigmentation, or rare scar following cryotherapy. Recommend Vaseline ointment to treated areas while healing.  ACTINIC DAMAGE - chronic, secondary to cumulative UV radiation exposure/sun exposure over time - diffuse scaly erythematous macules with  underlying dyspigmentation - Recommend daily broad spectrum sunscreen SPF 30+ to sun-exposed areas, reapply every 2 hours as needed.  - Recommend staying in the shade or wearing long sleeves, sun glasses (UVA+UVB protection) and wide brim hats (4-inch brim around the entire circumference of the hat). - Call for new or changing lesions.   Return in about 1 year (around 08/02/2025) for AK Follow Up, ISK Follow Up.  I, Kate Fought, CMA, am acting as scribe for Alm Rhyme, MD.   Documentation: I have reviewed the above documentation for accuracy and completeness, and I agree with the above.  Alm Rhyme, MD

## 2024-08-18 DIAGNOSIS — H903 Sensorineural hearing loss, bilateral: Secondary | ICD-10-CM | POA: Diagnosis not present

## 2024-08-18 DIAGNOSIS — H6063 Unspecified chronic otitis externa, bilateral: Secondary | ICD-10-CM | POA: Diagnosis not present

## 2024-09-20 DIAGNOSIS — H353124 Nonexudative age-related macular degeneration, left eye, advanced atrophic with subfoveal involvement: Secondary | ICD-10-CM | POA: Diagnosis not present

## 2024-09-20 DIAGNOSIS — H353114 Nonexudative age-related macular degeneration, right eye, advanced atrophic with subfoveal involvement: Secondary | ICD-10-CM | POA: Diagnosis not present

## 2024-10-18 ENCOUNTER — Ambulatory Visit: Payer: PPO

## 2024-10-21 ENCOUNTER — Ambulatory Visit: Payer: Self-pay

## 2024-10-21 ENCOUNTER — Ambulatory Visit

## 2024-10-21 VITALS — BP 110/66 | HR 58 | Temp 98.2°F | Ht 64.0 in | Wt 161.6 lb

## 2024-10-21 DIAGNOSIS — G629 Polyneuropathy, unspecified: Secondary | ICD-10-CM | POA: Diagnosis not present

## 2024-10-21 DIAGNOSIS — M25561 Pain in right knee: Secondary | ICD-10-CM

## 2024-10-21 DIAGNOSIS — I10 Essential (primary) hypertension: Secondary | ICD-10-CM | POA: Diagnosis not present

## 2024-10-21 DIAGNOSIS — E559 Vitamin D deficiency, unspecified: Secondary | ICD-10-CM | POA: Diagnosis not present

## 2024-10-21 DIAGNOSIS — G8929 Other chronic pain: Secondary | ICD-10-CM

## 2024-10-21 DIAGNOSIS — M8580 Other specified disorders of bone density and structure, unspecified site: Secondary | ICD-10-CM

## 2024-10-21 DIAGNOSIS — R001 Bradycardia, unspecified: Secondary | ICD-10-CM | POA: Diagnosis not present

## 2024-10-21 DIAGNOSIS — R7303 Prediabetes: Secondary | ICD-10-CM | POA: Diagnosis not present

## 2024-10-21 DIAGNOSIS — N3941 Urge incontinence: Secondary | ICD-10-CM

## 2024-10-21 DIAGNOSIS — Z780 Asymptomatic menopausal state: Secondary | ICD-10-CM | POA: Diagnosis not present

## 2024-10-21 DIAGNOSIS — E7800 Pure hypercholesterolemia, unspecified: Secondary | ICD-10-CM | POA: Diagnosis not present

## 2024-10-21 LAB — COMPREHENSIVE METABOLIC PANEL WITH GFR
ALT: 16 U/L (ref 0–35)
AST: 21 U/L (ref 0–37)
Albumin: 3.9 g/dL (ref 3.5–5.2)
Alkaline Phosphatase: 57 U/L (ref 39–117)
BUN: 24 mg/dL — ABNORMAL HIGH (ref 6–23)
CO2: 30 meq/L (ref 19–32)
Calcium: 9.3 mg/dL (ref 8.4–10.5)
Chloride: 102 meq/L (ref 96–112)
Creatinine, Ser: 0.94 mg/dL (ref 0.40–1.20)
GFR: 51.96 mL/min — ABNORMAL LOW (ref >60.00)
Glucose, Bld: 101 mg/dL — ABNORMAL HIGH (ref 70–99)
Potassium: 4.4 meq/L (ref 3.5–5.1)
Sodium: 140 meq/L (ref 135–145)
Total Bilirubin: 0.4 mg/dL (ref 0.2–1.2)
Total Protein: 6.4 g/dL (ref 6.0–8.3)

## 2024-10-21 LAB — HEMOGLOBIN A1C: Hgb A1c MFr Bld: 6 % (ref 4.6–6.5)

## 2024-10-21 LAB — VITAMIN D 25 HYDROXY (VIT D DEFICIENCY, FRACTURES): VITD: 38.8 ng/mL (ref 30.00–100.00)

## 2024-10-21 NOTE — Assessment & Plan Note (Signed)
 Plan per hypertension from today.

## 2024-10-21 NOTE — Assessment & Plan Note (Signed)
 Chronic, involving bilateral toe intermittent burning sensation with stable symptoms. Discussed potential treatment with gabapentin or pregabalin, but risks outweigh benefits currently. Will consider medication if symptoms become bothersome

## 2024-10-21 NOTE — Assessment & Plan Note (Addendum)
 Plan per osteopenia  Orders:   Vitamin D  (25 hydroxy)

## 2024-10-21 NOTE — Assessment & Plan Note (Signed)
 Chronic. Managed with pads. Symptoms stable, no current urinary tract infection symptoms. Provided pelvic floor exercise instructions.

## 2024-10-21 NOTE — Assessment & Plan Note (Addendum)
 BP within goal today.  Continue Triamterene -hydrochlorothiazide 37.5-25 mg (1/2 tablet daily), has helped with b/l lower leg edema.  Continue Metoprolol  succinate 50 mg once daily.  She has asymptomatic bradycardia, chronically and palpitations has resolved on Metoprolol . Benefit outweighs potential side effect.  No dizziness, weakness.  Check CMP today.  Also recommend annual follow up with cardiology.  Orders:   Comp Met (CMET)

## 2024-10-21 NOTE — Patient Instructions (Addendum)
 YOUR BONE DENISTY SCAN (dexa)  IS DUE, PLEASE CALL AND GET THIS SCHEDULED! [facility] - call [phone]    Continue to take vitamin D  2000 units daily. I also recommend taking calcium  citrate 600 mg twice a day to help with bone health.    Recommend annual follow up with cardiologist for palpitations:  Velinda Lunger, M.D., Ph.D. [facility] [place], [place] [phone]

## 2024-10-21 NOTE — Assessment & Plan Note (Addendum)
 A1c has been stable in the past. Check A1c today. Orders:   HgB A1c

## 2024-10-21 NOTE — Progress Notes (Signed)
 Established Patient Office Visit   Subjective  Patient ID: Jessica Reynolds, female    DOB: 07-May-1928  Age: 89 y.o. MRN: [id number]  Chief Complaint  Patient presents with   Hyperlipidemia   Hypertension    Discussed the use of AI scribe software for clinical note transcription with the patient, who gave verbal consent to proceed.  History of Present Illness  Jessica Reynolds is a 89 year old female with bilateral macular degeneration, hypertension, prediabetes, osteopenia, palpitations, right knee osteoarthritis who presents for follow-up on her chronic conditions.  - She has been receiving injections for macular degeneration since January 2024 affecting both eyes and causing occasional 'squiggly' vision and difficulty recognizing facial features at a distance. She restricts her driving to early mornings and stays within her neighborhood due to her vision limitations. She also uses the [employer] bus for transportation with her son.  - She has a history of osteopenia, diagnosed in July 2018, and takes vitamin D  2000 units daily. She has not had any falls and exercises to manage her knee pain.   - She stopped taking Crestor  5 mg daily due to palpitations and has been off since around the age of 79 despite getting previous prescriptions sent. She experienced palpitations while on the medication, which resolved after discontinuation. She continues to take metoprolol  succinate 50 mg once daily for palpitations and reports that stress can trigger mild palpitations. She also takes a low-dose aspirin daily. She denies dizziness, palpitations today. She has h/o asymptomatic bradycardia. She has seen cardiologist Dr. Gollan in the past (12/07/2023), plans on annual follow up with him.   - She experiences neuropathy in her toes, described as tingling, but it is not bothersome enough to require medication. Her B12 levels were normal, and she has prediabetes, which is monitored regularly.  - She takes  triamterene -hydrochlorothiazide 37.5-25 mg (1/2 tablet daily)  for swelling in her feet, which helps. Her BP has been well controlled on it.   - She has a history of urinary incontinence, for which she wears pads. She can go about five hours at night without needing to urinate and has not experienced any recent urinary tract infections. She does not perform pelvic floor exercises regularly but has done them naturally in the morning.  - She was found to have vitamin D  insufficiency during her last visit on 04/19/24, is taking 2000 units vitamin D  as recommended.   - Labs from 04/19/24 showed stable A1c was 6% , LDL improved from 155 to 97. CMP showed GFR stable at 54.22, Cr 0.91.  - She stays active. Sees orthopedic department for intermittent right knee osteoarthritis. Has received corticosteroid injection to the right knee in the past x 2. No history of fall.   ROS As per HPI    Objective:     BP 110/66 (BP Location: Right Arm, Patient Position: Sitting, Cuff Size: Normal)   Pulse (!) 58   Temp 98.2 F (36.8 C) (Oral)   Ht 5' 4 (1.626 m)   Wt 161 lb 9.6 oz (73.3 kg)   SpO2 97%   BMI 27.74 kg/m      10/21/2024    9:09 AM 11/06/2023    9:50 AM 10/13/2023   10:01 AM  Depression screen PHQ 2/9  Decreased Interest 0 0 0  Down, Depressed, Hopeless 0 0 0  PHQ - 2 Score 0 0 0  Altered sleeping 0 0 0  Tired, decreased energy 0 0 0  Change in appetite  0 0 0  Feeling bad or failure about yourself  0 0 0  Trouble concentrating 0 0 0  Moving slowly or fidgety/restless 0 0 0  Suicidal thoughts 0 0 0  PHQ-9 Score 0 0  0   Difficult doing work/chores Not difficult at all Not difficult at all Not difficult at all     Data saved with a previous flowsheet row definition      10/21/2024    9:09 AM 11/06/2023    9:51 AM 09/22/2023   10:20 AM 03/23/2023   10:32 AM  GAD 7 : Generalized Anxiety Score  Nervous, Anxious, on Edge 0 0 0 0  Control/stop worrying 0 0 0 0  Worry too much -  different things 0 0 0 0  Trouble relaxing 0 0 0 0  Restless 0 0 0 0  Easily annoyed or irritable 0 0 0 0  Afraid - awful might happen 0 0 0 0  Total GAD 7 Score 0 0 0 0  Anxiety Difficulty Not difficult at all Not difficult at all Not difficult at all Not difficult at all      10/21/2024    9:09 AM 11/06/2023    9:50 AM 10/13/2023   10:01 AM  Depression screen PHQ 2/9  Decreased Interest 0 0 0  Down, Depressed, Hopeless 0 0 0  PHQ - 2 Score 0 0 0  Altered sleeping 0 0 0  Tired, decreased energy 0 0 0  Change in appetite 0 0 0  Feeling bad or failure about yourself  0 0 0  Trouble concentrating 0 0 0  Moving slowly or fidgety/restless 0 0 0  Suicidal thoughts 0 0 0  PHQ-9 Score 0 0  0   Difficult doing work/chores Not difficult at all Not difficult at all Not difficult at all     Data saved with a previous flowsheet row definition      10/21/2024    9:09 AM 11/06/2023    9:51 AM 09/22/2023   10:20 AM 03/23/2023   10:32 AM  GAD 7 : Generalized Anxiety Score  Nervous, Anxious, on Edge 0 0 0 0  Control/stop worrying 0 0 0 0  Worry too much - different things 0 0 0 0  Trouble relaxing 0 0 0 0  Restless 0 0 0 0  Easily annoyed or irritable 0 0 0 0  Afraid - awful might happen 0 0 0 0  Total GAD 7 Score 0 0 0 0  Anxiety Difficulty Not difficult at all Not difficult at all Not difficult at all Not difficult at all   SDOH Screenings   Food Insecurity: No Food Insecurity (04/18/2024)  Housing: Unknown (06/01/2024)   Received from [facility]  Transportation Needs: No Transportation Needs (04/18/2024)  Utilities: Not At Risk (10/13/2023)  Alcohol Screen: Low Risk  (10/13/2023)  Depression (PHQ2-9): Low Risk  (10/21/2024)  Financial Resource Strain: Low Risk  (04/18/2024)  Physical Activity: Insufficiently Active (04/18/2024)  Social Connections: Moderately Integrated (04/18/2024)  Stress: No Stress Concern Present (04/18/2024)  Tobacco Use: Low Risk  (10/21/2024)   Health Literacy: Adequate Health Literacy (10/13/2023)     Physical Exam Constitutional:      General: She is not in acute distress. HENT:     Head: Normocephalic and atraumatic.     Ears:     Comments: Wearing hearing aids bilaterally.     Mouth/Throat:     Mouth: Mucous membranes are moist.  Cardiovascular:  Rate and Rhythm: Bradycardia present.     Heart sounds: No murmur heard. Pulmonary:     Effort: Pulmonary effort is normal.     Breath sounds: Normal breath sounds.  Abdominal:     General: Bowel sounds are normal.     Palpations: Abdomen is soft.     Tenderness: There is no abdominal tenderness. There is no guarding.  Musculoskeletal:     Cervical back: No rigidity.     Right lower leg: No edema.     Left lower leg: No edema.  Skin:    General: Skin is warm.  Neurological:     Mental Status: She is alert and oriented to person, place, and time.     Gait: Gait abnormal (slow, careful gait).  Psychiatric:        Mood and Affect: Mood normal.        No results found for any visits on 10/21/24.  The ASCVD Risk score (Arnett DK, et al., 2019) failed to calculate for the following reasons:   The 2019 ASCVD risk score is only valid for ages 70 to 70     Assessment & Plan:   Assessment & Plan Benign essential HTN BP within goal today.  Continue Triamterene -hydrochlorothiazide 37.5-25 mg (1/2 tablet daily), has helped with b/l lower leg edema.  Continue Metoprolol  succinate 50 mg once daily.  She has asymptomatic bradycardia, chronically and palpitations has resolved on Metoprolol . Benefit outweighs potential side effect.  No dizziness, weakness.  Check CMP today.  Also recommend annual follow up with cardiology.  Orders:   Comp Met (CMET)  Osteopenia after menopause Noted on DEXA scan from 06/16/2017.  Importance of vitamin D  and calcium  for bone health discussed. Shared decision to repeat DEXA scan. Ordered DEXA scan to assess current bone density.  Continue vitamin D  2000 units daily. Recommended calcium  citrate 600 mg twice daily. Repeat vitamin D  level today.   Orders:   DG Bone Density; Future  Prediabetes A1c has been stable in the past. Check A1c today. Orders:   HgB A1c  Vitamin D  insufficiency Plan per osteopenia  Orders:   Vitamin D  (25 hydroxy)  Asymptomatic bradycardia Plan per hypertension from today.     Neuropathy Chronic, involving bilateral toe intermittent burning sensation with stable symptoms. Discussed potential treatment with gabapentin or pregabalin, but risks outweigh benefits currently. Will consider medication if symptoms become bothersome    Pure hypercholesterolemia Chronic history, patient previously treated with Rosuvastatin  5 mg daily. She self elected to stop Rosuvastatin  and elects not to restart it.  Chronic pain of right knee Managed with occasional ibuprofen and cortisone injections. Pain well-managed with current treatment plan. Continue current management with ibuprofen as needed and cortisone injections as scheduled with orthopedic department.     Urge incontinence Chronic. Managed with pads. Symptoms stable, no current urinary tract infection symptoms. Provided pelvic floor exercise instructions.     I personally spent a total of 45 minutes in the care of the patient today including preparing to see the patient, performing a medically appropriate exam/evaluation, counseling and educating, placing orders, documenting clinical information in the EHR, independently interpreting results, and communicating results.   Return in about 6 months (around 04/20/2025) for Follow up with Dr. Abbey, medicare wellness phone call with [facility] .   Luke Abbey, MD

## 2024-10-21 NOTE — Assessment & Plan Note (Addendum)
 Noted on DEXA scan from 06/16/2017.  Importance of vitamin D  and calcium  for bone health discussed. Shared decision to repeat DEXA scan. Ordered DEXA scan to assess current bone density. Continue vitamin D  2000 units daily. Recommended calcium  citrate 600 mg twice daily. Repeat vitamin D  level today.   Orders:   DG Bone Density; Future

## 2024-10-21 NOTE — Assessment & Plan Note (Deleted)
Chronic issue.  Prior lab work revealed prediabetes.  Discussed potentially trying gabapentin or Lyrica.  Patient was given information on these medications.  Discussed the risk of balance issues and drowsiness with these medications.  She will let me know if she would like to try either of them.

## 2024-10-21 NOTE — Assessment & Plan Note (Signed)
 Chronic history, patient previously treated with Rosuvastatin  5 mg daily. She self elected to stop Rosuvastatin  and elects not to restart it.

## 2024-10-21 NOTE — Assessment & Plan Note (Signed)
 Managed with occasional ibuprofen and cortisone injections. Pain well-managed with current treatment plan. Continue current management with ibuprofen as needed and cortisone injections as scheduled with orthopedic department.

## 2024-10-26 ENCOUNTER — Ambulatory Visit (INDEPENDENT_AMBULATORY_CARE_PROVIDER_SITE_OTHER): Admitting: *Deleted

## 2024-10-26 VITALS — Ht 64.0 in | Wt 161.0 lb

## 2024-10-26 DIAGNOSIS — Z0000 Encounter for general adult medical examination without abnormal findings: Secondary | ICD-10-CM | POA: Diagnosis not present

## 2024-10-26 NOTE — Patient Instructions (Signed)
 Ms. Musco,  Thank you for taking the time for your Medicare Wellness Visit. I appreciate your continued commitment to your health goals. Please review the care plan we discussed, and feel free to reach out if I can assist you further.  Please note that Annual Wellness Visits do not include a physical exam. Some assessments may be limited, especially if the visit was conducted virtually. If needed, we may recommend an in-person follow-up with your provider.  Ongoing Care Seeing your primary care provider every 3 to 6 months helps us  monitor your health and provide consistent, personalized care.  Remember to update your Tetanus (Tdap) vaccine at your pharmacy.  Referrals If a referral was made during today's visit and you haven't received any updates within two weeks, please contact the referred provider directly to check on the status.  Recommended Screenings:  Health Maintenance  Topic Date Due   DTaP/Tdap/Td vaccine (2 - Tdap) 01/18/2023   COVID-19 Vaccine (7 - 2025-26 season) 08/15/2024   Medicare Annual Wellness Visit  10/26/2025   Pneumococcal Vaccine for age over 32  Completed   Flu Shot  Completed   DEXA scan (bone density measurement)  Completed   Zoster (Shingles) Vaccine  Completed   Meningitis B Vaccine  Aged Out       10/26/2024    1:47 PM  Advanced Directives  Does Patient Have a Medical Advance Directive? Yes  Type of Estate Agent of [place];Living will  Does patient want to make changes to medical advance directive? No - Patient declined  Copy of Healthcare Power of Attorney in Chart? No - copy requested    Vision: Annual vision screenings are recommended for early detection of glaucoma, cataracts, and diabetic retinopathy. These exams can also reveal signs of chronic conditions such as diabetes and high blood pressure.  Dental: Annual dental screenings help detect early signs of oral cancer, gum disease, and other conditions linked to  overall health, including heart disease and diabetes.  Please see the attached documents for additional preventive care recommendations.

## 2024-10-26 NOTE — Progress Notes (Signed)
 Chief Complaint  Patient presents with   Medicare Wellness     Subjective:   Jessica Reynolds is a 89 y.o. female who presents for a Medicare Annual Wellness Visit.  Allergies (verified) Celecoxib and Doxycycline   History: Past Medical History:  Diagnosis Date   Arrhythmia    Arthritis    shoulder   Colon polyp    Headache    migraines   HOH (hard of hearing)    wears aids   Hypercholesterolemia    Hypertension    Macular degeneration    Osteoarthritis    UTI (lower urinary tract infection)    Vitamin D  insufficiency 04/19/2024   Recommended patient start vitamin D  2,000 IU daily, can get it otc   Past Surgical History:  Procedure Laterality Date   ABDOMINAL HYSTERECTOMY  1980   BSO, fibroids   ACHILLES TENDON SURGERY Right 1990   ACHILLES TENDON SURGERY Left 1997   CATARACT EXTRACTION W/PHACO Right 09/13/2019   Procedure: CATARACT EXTRACTION PHACO AND INTRAOCULAR LENS PLACEMENT (IOC) RIGHT 01:01.4           11.2%           6.93;  Surgeon: Jaye Fallow, MD;  Location: [facility];  Service: Ophthalmology;  Laterality: Right;   CATARACT EXTRACTION W/PHACO Left 10/04/2019   Procedure: CATARACT EXTRACTION PHACO AND INTRAOCULAR LENS PLACEMENT (IOC) LEFT  00:53.5  15.5%  8.31;  Surgeon: Jaye Fallow, MD;  Location: [facility];  Service: Ophthalmology;  Laterality: Left;   ECTOPIC PREGNANCY SURGERY  1960   VAGINAL DELIVERY     4   Family History  Problem Relation Age of Onset   Stroke Mother    Heart disease Father    Macular degeneration Sister    Diabetes Sister    Breast cancer Neg Hx    Social History   Occupational History   Not on file  Tobacco Use   Smoking status: Never   Smokeless tobacco: Never  Vaping Use   Vaping status: Never Used  Substance and Sexual Activity   Alcohol use: Yes    Comment: social   Drug use: No   Sexual activity: Not on file   Tobacco Counseling Counseling given: Not Answered  SDOH  Screenings   Food Insecurity: No Food Insecurity (10/21/2024)  Housing: Low Risk  (10/21/2024)  Transportation Needs: No Transportation Needs (10/21/2024)  Utilities: Not At Risk (10/26/2024)  Alcohol Screen: Low Risk  (10/13/2023)  Depression (PHQ2-9): Low Risk  (10/26/2024)  Financial Resource Strain: Low Risk  (10/21/2024)  Physical Activity: Insufficiently Active (10/26/2024)  Social Connections: Moderately Integrated (10/26/2024)  Stress: No Stress Concern Present (10/26/2024)  Tobacco Use: Low Risk  (10/26/2024)  Health Literacy: Adequate Health Literacy (10/26/2024)   See flowsheets for full screening details  Depression Screen PHQ 2 & 9 Depression Scale- Over the past 2 weeks, how often have you been bothered by any of the following problems? Little interest or pleasure in doing things: 0 Feeling down, depressed, or hopeless (PHQ Adolescent also includes...irritable): 0 PHQ-2 Total Score: 0 Trouble falling or staying asleep, or sleeping too much: 0 Feeling tired or having little energy: 0 Poor appetite or overeating (PHQ Adolescent also includes...weight loss): 0 Feeling bad about yourself - or that you are a failure or have let yourself or your family down: 0 Trouble concentrating on things, such as reading the newspaper or watching television (PHQ Adolescent also includes...like school work): 0 Moving or speaking so slowly that other  people could have noticed. Or the opposite - being so fidgety or restless that you have been moving around a lot more than usual: 0 Thoughts that you would be better off dead, or of hurting yourself in some way: 0 PHQ-9 Total Score: 0 If you checked off any problems, how difficult have these problems made it for you to do your work, take care of things at home, or get along with other people?: Not difficult at all     Goals Addressed             This Visit's Progress    Patient Stated       Wants to eat well and stay active       Visit  info / Clinical Intake: Medicare Wellness Visit Type:: Subsequent Annual Wellness Visit Persons participating in visit:: patient Medicare Wellness Visit Mode:: Telephone If telephone:: video declined Because this visit was a virtual/telehealth visit:: pt reported vitals If Telephone or Video please confirm:: I connected with the patient using audio enabled telemedicine application and verified that I am speaking with the correct person using two identifiers; I discussed the limitations of evaluation and management by telemedicine; The patient expressed understanding and agreed to proceed Patient Location:: Home Provider Location:: Office/Clinic Information given by:: patient Interpreter Needed?: No Pre-visit prep was completed: yes AWV questionnaire completed by patient prior to visit?: no Living arrangements:: with family/others Patient's Overall Health Status Rating: very good Typical amount of pain: some Does pain affect daily life?: no Are you currently prescribed opioids?: no  Dietary Habits and Nutritional Risks How many meals a day?: 3 Eats fruit and vegetables daily?: yes Most meals are obtained by: preparing own meals In the last 2 weeks, have you had any of the following?: none Diabetic:: no  Functional Status Activities of Daily Living (to include ambulation/medication): Independent Ambulation: Independent Medication Administration: Independent Home Management: Independent Manage your own finances?: yes Primary transportation is: driving Concerns about vision?: (!) yes Concerns about hearing?: (!) yes Uses hearing aids?: (!) yes Hear whispered voice?: -- (televisit)  Fall Screening Falls in the past year?: 0 Number of falls in past year: 0 Was there an injury with Fall?: 0 Fall Risk Category Calculator: 0 Patient Fall Risk Level: Low Fall Risk  Fall Risk Patient at Risk for Falls Due to: No Fall Risks Fall risk Follow up: Falls evaluation completed; Falls  prevention discussed  Home and Transportation Safety: All rugs have non-skid backing?: N/A, no rugs All stairs or steps have railings?: yes Grab bars in the bathtub or shower?: yes Have non-skid surface in bathtub or shower?: yes Good home lighting?: yes Regular seat belt use?: yes Hospital stays in the last year:: no  Cognitive Assessment Difficulty concentrating, remembering, or making decisions? : no Will 6CIT or Mini Cog be Completed: yes What year is it?: 0 points What month is it?: 0 points Give patient an address phrase to remember (5 components): [address] [place] About what time is it?: 0 points Count backwards from 20 to 1: 0 points Say the months of the year in reverse: 0 points Repeat the address phrase from earlier: 0 points 6 CIT Score: 0 points  Advance Directives (For Healthcare) Does Patient Have a Medical Advance Directive?: Yes Does patient want to make changes to medical advance directive?: No - Patient declined Type of Advance Directive: Healthcare Power of [place]; Living will Copy of Healthcare Power of Attorney in Chart?: No - copy requested Copy of Living  Will in Chart?: No - copy requested  Reviewed/Updated  Reviewed/Updated: Reviewed All (Medical, Surgical, Family, Medications, Allergies, Care Teams, Patient Goals)        Objective:    Today's Vitals   10/26/24 1344  Weight: 161 lb (73 kg)  Height: 5' 4 (1.626 m)   Body mass index is 27.64 kg/m.  Current Medications (verified) Outpatient Encounter Medications as of 10/26/2024  Medication Sig   aspirin EC 81 MG tablet Take 81 mg by mouth daily.   cholecalciferol (VITAMIN D3) 25 MCG (1000 UNIT) tablet Take 2,000 Units by mouth daily.   fluticasone  (FLONASE ) 50 MCG/ACT nasal spray Use 2 spray(s) in each nostril once daily   ibuprofen (ADVIL) 200 MG tablet Take 200 mg by mouth every 6 (six) hours as needed.   metoprolol  succinate (TOPROL -XL) 50 MG 24 hr tablet Take 1 tablet (50  mg total) by mouth daily. Take with or immediately following a meal.   Multiple Vitamins-Minerals (PRESERVISION/LUTEIN) CAPS Take 2 capsules by mouth daily.   triamterene -hydrochlorothiazide (MAXZIDE-25) 37.5-25 MG tablet TAKE 1/2 (ONE-HALF) TABLET BY MOUTH IN THE MORNING   No facility-administered encounter medications on file as of 10/26/2024.   Hearing/Vision screen Hearing Screening - Comments:: Wears aids Vision Screening - Comments:: Readers [facility] Up to date has macular degeneration Immunizations and Health Maintenance Health Maintenance  Topic Date Due   DTaP/Tdap/Td (2 - Tdap) 01/18/2023   COVID-19 Vaccine (7 - 2025-26 season) 08/15/2024   Medicare Annual Wellness (AWV)  10/26/2025   Pneumococcal Vaccine: 50+ Years  Completed   Influenza Vaccine  Completed   DEXA SCAN  Completed   Zoster Vaccines- Shingrix  Completed   Meningococcal B Vaccine  Aged Out        Assessment/Plan:  This is a routine wellness examination for Jessica Reynolds.  Patient Care Team: Bair, Kalpana, MD as PCP - General (Family Medicine) Perla Evalene PARAS, MD as Consulting Physician (Cardiology) Hester Alm BROCKS, MD (Dermatology)  I have personally reviewed and noted the following in the patient's chart:   Medical and social history Use of alcohol, tobacco or illicit drugs  Current medications and supplements including opioid prescriptions. Functional ability and status Nutritional status Physical activity Advanced directives List of other physicians Hospitalizations, surgeries, and ER visits in previous 12 months Vitals Screenings to include cognitive, depression, and falls Referrals and appointments  No orders of the defined types were placed in this encounter.  In addition, I have reviewed and discussed with patient certain preventive protocols, quality metrics, and best practice recommendations. A written personalized care plan for preventive services as well as general preventive health  recommendations were provided to patient.   Angeline Fredericks, LPN   [id number]   Return in 1 year (on 10/26/2025).  After Visit Summary: (MyChart) Due to this being a telephonic visit, the after visit summary with patients personalized plan was offered to patient via MyChart   Nurse Notes: Discussed the need to update tetanus (Tdap) vaccine. Patient declines covid vaccine.

## 2024-11-08 DIAGNOSIS — M1711 Unilateral primary osteoarthritis, right knee: Secondary | ICD-10-CM | POA: Diagnosis not present

## 2024-11-17 DIAGNOSIS — H353124 Nonexudative age-related macular degeneration, left eye, advanced atrophic with subfoveal involvement: Secondary | ICD-10-CM | POA: Diagnosis not present

## 2024-11-17 DIAGNOSIS — H353114 Nonexudative age-related macular degeneration, right eye, advanced atrophic with subfoveal involvement: Secondary | ICD-10-CM | POA: Diagnosis not present

## 2024-11-22 ENCOUNTER — Other Ambulatory Visit

## 2024-12-18 NOTE — Progress Notes (Unsigned)
 Cardiology Office Note  Date:  12/20/2024   ID:  REESE SENK, DOB [birth year], MRN [id number]  PCP:  Abbey Bruckner, MD   Chief Complaint  Patient presents with   6 month follow up     Patient c/o shortness of breath with exertion. Patient was taking Crestor  but stopped due to palpitations.     HPI:  Jessica Reynolds is a 89 year old woman with past medical history of SVT PVCs Essential hypertension Hyperlipidemia Prediabetes Who presents for follow-up of her frequent PVCs Previously seen by [facility] cardiology  Quit crestor , attributed the medication with palpitations (Was having root canals) Has palpitations with stress  She lives with husband and handicapped son, she drives him, cares for him, he is in his 60s, history of epilepsy, cognitive issues  Tolerating metoprolol  succinate 50 daily No orthostasis symptoms, no near-syncope or syncope  No regular exercise program No significant lower extremity edema  Does her ADLs, shopping No regular exercise program  Echo November 2022 from outside facility NORMAL LEFT VENTRICULAR SYSTOLIC FUNCTION  NORMAL RIGHT VENTRICULAR SYSTOLIC FUNCTION  NO VALVULAR STENOSIS  MILD MR, TR, PR  EF 45-50%   Prior monitor October 2022 Normal carotid ultrasound August 2019  For hypertension is taking metoprolol , triamterene  hydrochlorothiazide  Total chol: 233, LDL 155 off Crestor   EKG personally reviewed by myself on todays visit EKG Interpretation Date/Time:  Tuesday December 20 2024 08:58:49 EST Ventricular Rate:  57 PR Interval:  162 QRS Duration:  72 QT Interval:  400 QTC Calculation: 389 R Axis:   14  Text Interpretation: Sinus bradycardia When compared with ECG of 07-Dec-2023 10:51, No significant change was found Confirmed by Perla Lye [phone]) on 12/20/2024 9:25:06 AM    PMH:   has a past medical history of Arrhythmia, Arthritis, Colon polyp, Headache, HOH (hard of hearing), Hypercholesterolemia, Hypertension,  Macular degeneration, Osteoarthritis, UTI (lower urinary tract infection), and Vitamin D  insufficiency (04/19/2024).  PSH:    Past Surgical History:  Procedure Laterality Date   ABDOMINAL HYSTERECTOMY  1980   BSO, fibroids   ACHILLES TENDON SURGERY Right 1990   ACHILLES TENDON SURGERY Left 1997   CATARACT EXTRACTION W/PHACO Right 09/13/2019   Procedure: CATARACT EXTRACTION PHACO AND INTRAOCULAR LENS PLACEMENT (IOC) RIGHT 01:01.4           11.2%           6.93;  Surgeon: Jaye Fallow, MD;  Location: [facility];  Service: Ophthalmology;  Laterality: Right;   CATARACT EXTRACTION W/PHACO Left 10/04/2019   Procedure: CATARACT EXTRACTION PHACO AND INTRAOCULAR LENS PLACEMENT (IOC) LEFT  00:53.5  15.5%  8.31;  Surgeon: Jaye Fallow, MD;  Location: [facility];  Service: Ophthalmology;  Laterality: Left;   ECTOPIC PREGNANCY SURGERY  1960   VAGINAL DELIVERY     4    Current Outpatient Medications  Medication Sig Dispense Refill   amoxicillin (AMOXIL) 500 MG capsule Take 500 mg by mouth 3 (three) times daily.     aspirin EC 81 MG tablet Take 81 mg by mouth daily.     cholecalciferol (VITAMIN D3) 25 MCG (1000 UNIT) tablet Take 2,000 Units by mouth daily.     fluticasone  (FLONASE ) 50 MCG/ACT nasal spray Use 2 spray(s) in each nostril once daily 16 g 0   ibuprofen (ADVIL) 200 MG tablet Take 200 mg by mouth every 6 (six) hours as needed.     metoprolol  succinate (TOPROL -XL) 50 MG 24 hr tablet Take 1 tablet (50 mg total) by mouth daily.  Take with or immediately following a meal. 90 tablet 3   Multiple Vitamins-Minerals (PRESERVISION/LUTEIN) CAPS Take 2 capsules by mouth daily.     triamterene -hydrochlorothiazide (MAXZIDE-25) 37.5-25 MG tablet TAKE 1/2 (ONE-HALF) TABLET BY MOUTH IN THE MORNING 90 tablet 2   No current facility-administered medications for this visit.    Allergies:   Celecoxib and Doxycycline   Social History:  The patient  reports that she has never smoked.  She has never used smokeless tobacco. She reports current alcohol use. She reports that she does not use drugs.   Family History:   family history includes Diabetes in her sister; Heart disease in her father; Macular degeneration in her sister; Stroke in her mother.    Review of Systems: Review of Systems  Constitutional: Negative.   HENT: Negative.    Respiratory: Negative.    Cardiovascular: Negative.   Gastrointestinal: Negative.   Musculoskeletal: Negative.   Neurological: Negative.   Psychiatric/Behavioral: Negative.    All other systems reviewed and are negative.   PHYSICAL EXAM: VS:  BP 110/60 (BP Location: Left Arm, Patient Position: Sitting, Cuff Size: Normal)   Pulse (!) 57   Ht 5' 4 (1.626 m)   Wt 160 lb 2 oz (72.6 kg)   SpO2 99%   BMI 27.49 kg/m  , BMI Body mass index is 27.49 kg/m.   Recent Labs: 10/21/2024: ALT 16; BUN 24; Creatinine, Ser 0.94; Potassium 4.4; Sodium 140    Lipid Panel Lab Results  Component Value Date   CHOL 175 04/19/2024   HDL 48.80 04/19/2024   LDLCALC 97 04/19/2024   TRIG 147.0 04/19/2024      Wt Readings from Last 3 Encounters:  12/20/24 160 lb 2 oz (72.6 kg)  10/26/24 161 lb (73 kg)  10/21/24 161 lb 9.6 oz (73.3 kg)     ASSESSMENT AND PLAN:  Problem List Items Addressed This Visit       Cardiology Problems   Benign essential HTN (Chronic)   Relevant Orders   EKG 12-Lead (Completed)   Pure hypercholesterolemia   Frequent PVCs   Relevant Orders   EKG 12-Lead (Completed)   PSVT (paroxysmal supraventricular tachycardia) - Primary   Relevant Orders   EKG 12-Lead (Completed)     Other   Prediabetes    PVCs Tolerating metoprolol  succinate 50 daily, symptoms relatively well-controlled Breakthrough palpitations with stress  Essential hypertension Blood pressure is well controlled on today's visit. No changes made to the medications.  Paroxysmal SVT Denies significant tachycardia episodes on the metoprolol    continue metoprolol  succinate 50 daily  Hyperlipidemia Did not tolerate Crestor ,  Prefers not to be on a statin Start zetia  10 mg daily    Signed, Jessica Reynolds, M.D., Ph.D. [facility] [place], [place] [phone]

## 2024-12-20 ENCOUNTER — Ambulatory Visit: Attending: Cardiovascular Disease | Admitting: Cardiovascular Disease

## 2024-12-20 ENCOUNTER — Encounter: Payer: Self-pay | Admitting: Cardiovascular Disease

## 2024-12-20 VITALS — BP 110/60 | HR 57 | Ht 64.0 in | Wt 160.1 lb

## 2024-12-20 DIAGNOSIS — R7303 Prediabetes: Secondary | ICD-10-CM | POA: Diagnosis not present

## 2024-12-20 DIAGNOSIS — I493 Ventricular premature depolarization: Secondary | ICD-10-CM

## 2024-12-20 DIAGNOSIS — I471 Supraventricular tachycardia, unspecified: Secondary | ICD-10-CM

## 2024-12-20 DIAGNOSIS — Z789 Other specified health status: Secondary | ICD-10-CM

## 2024-12-20 DIAGNOSIS — E7800 Pure hypercholesterolemia, unspecified: Secondary | ICD-10-CM

## 2024-12-20 DIAGNOSIS — I10 Essential (primary) hypertension: Secondary | ICD-10-CM

## 2024-12-20 MED ORDER — EZETIMIBE 10 MG PO TABS: 10.0000 mg | ORAL_TABLET | Freq: Every day | ORAL | 3 refills | Status: AC

## 2024-12-20 NOTE — Patient Instructions (Addendum)
 Medication Instructions:  ?Please start zetia 10 mg daily  ? ?If you need a refill on your cardiac medications before your next appointment, please call your pharmacy.  ? ?Lab work: ?No new labs needed ? ?Testing/Procedures: ?No new testing needed ? ?Follow-Up: ?At [facility], you and your health needs are our priority.  As part of our continuing mission to provide you with exceptional heart care, we have created designated Provider Care Teams.  These Care Teams include your primary Cardiologist (physician) and Advanced Practice Providers (APPs -  Physician Assistants and Nurse Practitioners) who all work together to provide you with the care you need, when you need it. ? ?You will need a follow up appointment in 12 months ? ?Providers on your designated Care Team:   ?Nicolasa Ducking, NP ?Eula Listen, PA-C ?Cadence Fransico Michael, PA-C ? ?COVID-19 Vaccine Information can be found at: [email or web address] For questions related to vaccine distribution or appointments, please email [email or web address] or call [phone].  ? ?

## 2025-04-24 ENCOUNTER — Ambulatory Visit

## 2025-08-02 ENCOUNTER — Ambulatory Visit: Admitting: Dermatology

## 2025-10-31 ENCOUNTER — Ambulatory Visit
